# Patient Record
Sex: Male | Born: 1955
Health system: Southern US, Community
[De-identification: ages and names within clinical notes are randomized; demographics above are authoritative.]

## PROBLEM LIST (undated history)

## (undated) HISTORY — PX: OTHER SURGICAL HISTORY: SHX169

## (undated) HISTORY — PX: HOLEP-LASER ENUCLEATION OF THE PROSTATE WITH MORCELLATION: SHX6641

## (undated) HISTORY — PX: LEG AMPUTATION BELOW KNEE: SHX694

---

## 2013-05-14 DIAGNOSIS — T8730 Neuroma of amputation stump, unspecified extremity: Secondary | ICD-10-CM | POA: Insufficient documentation

## 2013-05-14 DIAGNOSIS — Z89519 Acquired absence of unspecified leg below knee: Secondary | ICD-10-CM | POA: Insufficient documentation

## 2014-08-04 DIAGNOSIS — R7989 Other specified abnormal findings of blood chemistry: Secondary | ICD-10-CM | POA: Insufficient documentation

## 2019-02-14 ENCOUNTER — Emergency Department
Admission: EM | Admit: 2019-02-14 | Discharge: 2019-02-14 | Disposition: A | Discharge status: Home / Self Care | Payer: Medicare PPO | Attending: Student in an Organized Health Care Education/Training Program | Admitting: Student in an Organized Health Care Education/Training Program

## 2019-02-14 ENCOUNTER — Emergency Department: Payer: Medicare PPO

## 2019-02-14 ENCOUNTER — Other Ambulatory Visit: Payer: Self-pay

## 2019-02-14 ENCOUNTER — Encounter: Payer: Self-pay | Admitting: Emergency Medicine

## 2019-02-14 DIAGNOSIS — J939 Pneumothorax, unspecified: Secondary | ICD-10-CM | POA: Diagnosis not present

## 2019-02-14 DIAGNOSIS — R0789 Other chest pain: Secondary | ICD-10-CM | POA: Diagnosis present

## 2019-02-14 DIAGNOSIS — R51 Headache: Secondary | ICD-10-CM | POA: Diagnosis not present

## 2019-02-14 MED ORDER — HYDROCODONE-ACETAMINOPHEN 5-325 MG PO TABS: 1.0000 | ORAL_TABLET | ORAL | 0 refills | Status: DC | PRN

## 2019-02-14 MED ORDER — HYDROCODONE-ACETAMINOPHEN 5-325 MG PO TABS
1.0000 | ORAL_TABLET | Freq: Once | ORAL | Status: AC
  Administered 2019-02-14: 15:00:00 1 via ORAL
  Filled 2019-02-14: qty 1

## 2019-02-14 MED ORDER — FENTANYL CITRATE (PF) 100 MCG/2ML IJ SOLN: 50.0000 ug | INTRAMUSCULAR | Status: DC | PRN

## 2019-02-14 MED ORDER — HYDROCODONE-ACETAMINOPHEN 5-325 MG PO TABS
2.0000 | ORAL_TABLET | Freq: Once | ORAL | Status: AC
  Administered 2019-02-14: 2 via ORAL
  Filled 2019-02-14: qty 2
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
## 2019-02-14 NOTE — ED Provider Notes (Signed)
Chest x-ray shows unchanged pneumothorax.  We will discharge the patient.   Arnaldo Natal, MD 02/14/19 [phone]

## 2019-02-14 NOTE — Discharge Instructions (Addendum)
Please return for any increasing shortness of breath chest pain or any other new problems or complaints.  Please follow-up with your regular doctor later on this week.  You can use 2 Advil 3 times a day for 3 days for pain.  You can also use Tylenol.

## 2019-02-14 NOTE — Progress Notes (Signed)
IS education completed, pt tol well. Pt inspire 2546ml+

## 2019-02-14 NOTE — ED Notes (Signed)
Portable imaging staff to bedside.

## 2019-02-14 NOTE — ED Notes (Signed)
AAOx3.  Skin warm and dry.  NAD 

## 2019-02-14 NOTE — ED Notes (Signed)
Report to [employer] to major room 25 via w/c

## 2019-02-17 ENCOUNTER — Inpatient Hospital Stay
Admission: EM | Admit: 2019-02-17 | Discharge: 2019-02-21 | DRG: 603 | Disposition: A | Discharge status: Home / Self Care | Payer: Medicare PPO | Attending: Internal Medicine | Admitting: Internal Medicine

## 2019-02-17 ENCOUNTER — Encounter: Payer: Self-pay | Admitting: Emergency Medicine

## 2019-02-17 ENCOUNTER — Other Ambulatory Visit: Payer: Self-pay

## 2019-02-17 DIAGNOSIS — B9562 Methicillin resistant Staphylococcus aureus infection as the cause of diseases classified elsewhere: Secondary | ICD-10-CM | POA: Diagnosis present

## 2019-02-17 DIAGNOSIS — Z6826 Body mass index (BMI) 26.0-26.9, adult: Secondary | ICD-10-CM

## 2019-02-17 DIAGNOSIS — L0390 Cellulitis, unspecified: Secondary | ICD-10-CM | POA: Diagnosis present

## 2019-02-17 DIAGNOSIS — L538 Other specified erythematous conditions: Secondary | ICD-10-CM | POA: Diagnosis not present

## 2019-02-17 DIAGNOSIS — E663 Overweight: Secondary | ICD-10-CM | POA: Diagnosis present

## 2019-02-17 DIAGNOSIS — Z89519 Acquired absence of unspecified leg below knee: Secondary | ICD-10-CM

## 2019-02-17 DIAGNOSIS — L03211 Cellulitis of face: Secondary | ICD-10-CM | POA: Diagnosis not present

## 2019-02-17 DIAGNOSIS — T368X5A Adverse effect of other systemic antibiotics, initial encounter: Secondary | ICD-10-CM | POA: Diagnosis not present

## 2019-02-17 DIAGNOSIS — F411 Generalized anxiety disorder: Secondary | ICD-10-CM | POA: Diagnosis present

## 2019-02-17 MED ORDER — SODIUM CHLORIDE 0.9 % IV SOLN
1.0000 g | Freq: Once | INTRAVENOUS | Status: AC
  Administered 2019-02-17: 1 g via INTRAVENOUS
  Filled 2019-02-17: qty 10

## 2019-02-17 MED ORDER — CLINDAMYCIN PHOSPHATE 600 MG/50ML IV SOLN
600.0000 mg | Freq: Once | INTRAVENOUS | Status: AC
  Administered 2019-02-18: 600 mg via INTRAVENOUS
  Filled 2019-02-17: qty 50

## 2019-02-17 MED ORDER — CEPHALEXIN 500 MG PO CAPS: 500.0000 mg | ORAL_CAPSULE | Freq: Three times a day (TID) | ORAL | 0 refills | Status: DC

## 2019-02-17 MED ORDER — CLINDAMYCIN HCL 300 MG PO CAPS: 300.0000 mg | ORAL_CAPSULE | Freq: Three times a day (TID) | ORAL | 0 refills | Status: DC
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
## 2019-02-17 NOTE — Discharge Instructions (Signed)
1.  Change your antibiotics to the following: Keflex 500 mg 3 times daily x7 days Clindamycin 300 mg 3 times daily x10 days 2.  Apply warm compress to affected area several times daily. 3.  Return to the ER in 1 day for recheck. 4.  Return to the ER sooner for worsening symptoms, increased redness/swelling, fever, persistent vomiting, difficulty breathing or other concerns.

## 2019-02-17 NOTE — ED Triage Notes (Signed)
Pt reports raised area with redness to the chin x1 day. Pt denies drainage. Pt to ED for concern that area of swelling is going to "cause closure to throat." Pt speaking in complete sentences, airway clear.

## 2019-02-18 ENCOUNTER — Encounter: Payer: Self-pay | Admitting: *Deleted

## 2019-02-18 DIAGNOSIS — L0390 Cellulitis, unspecified: Secondary | ICD-10-CM | POA: Diagnosis present

## 2019-02-18 LAB — CBC WITH DIFFERENTIAL/PLATELET
Abs Immature Granulocytes: 0.03 10*3/uL (ref 0.00–0.07)
Basophils Absolute: 0 10*3/uL (ref 0.0–0.1)
Basophils Relative: 0 %
Eosinophils Absolute: 0.1 10*3/uL (ref 0.0–0.5)
Eosinophils Relative: 1 %
HCT: 42.5 % (ref 39.0–52.0)
Hemoglobin: 14.7 g/dL (ref 13.0–17.0)
Immature Granulocytes: 0 %
Lymphocytes Relative: 11 %
Lymphs Abs: 1.1 10*3/uL (ref 0.7–4.0)
MCH: 31.3 pg (ref 26.0–34.0)
MCHC: 34.6 g/dL (ref 30.0–36.0)
MCV: 90.4 fL (ref 80.0–100.0)
Monocytes Absolute: 0.7 10*3/uL (ref 0.1–1.0)
Monocytes Relative: 7 %
Neutro Abs: 8.4 10*3/uL — ABNORMAL HIGH (ref 1.7–7.7)
Neutrophils Relative %: 81 %
Platelets: 219 10*3/uL (ref 150–400)
RBC: 4.7 MIL/uL (ref 4.22–5.81)
RDW: 12.3 % (ref 11.5–15.5)
WBC: 10.4 10*3/uL (ref 4.0–10.5)
nRBC: 0 % (ref 0.0–0.2)

## 2019-02-18 LAB — BASIC METABOLIC PANEL
Anion gap: 7 (ref 5–15)
BUN: 14 mg/dL (ref 6–20)
CO2: 27 mmol/L (ref 22–32)
Calcium: 8.9 mg/dL (ref 8.9–10.3)
Chloride: 104 mmol/L (ref 98–111)
Creatinine, Ser: 1.08 mg/dL (ref 0.61–1.24)
GFR calc Af Amer: >60 mL/min (ref >60)
GFR calc non Af Amer: >60 mL/min (ref >60)
Glucose, Bld: 94 mg/dL (ref 70–99)
Potassium: 4 mmol/L (ref 3.5–5.1)
Sodium: 138 mmol/L (ref 135–145)

## 2019-02-18 LAB — TSH: TSH: 5.828 u[IU]/mL — ABNORMAL HIGH (ref 0.350–4.500)

## 2019-02-18 MED ORDER — ACETAMINOPHEN 650 MG RE SUPP: 650.0000 mg | Freq: Four times a day (QID) | RECTAL | Status: DC | PRN

## 2019-02-18 MED ORDER — SODIUM CHLORIDE 0.9 % IV SOLN
3.0000 g | Freq: Four times a day (QID) | INTRAVENOUS | Status: DC
  Administered 2019-02-18: 3 g via INTRAVENOUS
  Filled 2019-02-18 (×5): qty 3

## 2019-02-18 MED ORDER — MORPHINE SULFATE (PF) 2 MG/ML IV SOLN
2.0000 mg | Freq: Once | INTRAVENOUS | Status: AC
  Administered 2019-02-18: 2 mg via INTRAVENOUS
  Filled 2019-02-18: qty 1

## 2019-02-18 MED ORDER — VANCOMYCIN HCL 10 G IV SOLR
2000.0000 mg | INTRAVENOUS | Status: DC
  Administered 2019-02-18: 15:00:00 2000 mg via INTRAVENOUS
  Filled 2019-02-18 (×2): qty 2000

## 2019-02-18 MED ORDER — CLINDAMYCIN PHOSPHATE 600 MG/50ML IV SOLN
600.0000 mg | Freq: Three times a day (TID) | INTRAVENOUS | Status: DC
  Administered 2019-02-18 – 2019-02-21 (×9): 600 mg via INTRAVENOUS
  Filled 2019-02-18 (×12): qty 50

## 2019-02-18 MED ORDER — CEFAZOLIN SODIUM-DEXTROSE 1-4 GM/50ML-% IV SOLN
1.0000 g | Freq: Three times a day (TID) | INTRAVENOUS | Status: DC
  Administered 2019-02-18: 05:00:00 1 g via INTRAVENOUS
  Filled 2019-02-18 (×3): qty 50

## 2019-02-18 MED ORDER — HYDROCODONE-ACETAMINOPHEN 5-325 MG PO TABS
1.0000 | ORAL_TABLET | ORAL | Status: DC | PRN
  Administered 2019-02-18 – 2019-02-19 (×7): 1 via ORAL
  Filled 2019-02-18 (×8): qty 1

## 2019-02-18 MED ORDER — SODIUM CHLORIDE 0.9 % IV SOLN
INTRAVENOUS | Status: DC | PRN
  Administered 2019-02-18: 05:00:00 250 mL via INTRAVENOUS
  Administered 2019-02-18 – 2019-02-19 (×3): 500 mL via INTRAVENOUS
  Administered 2019-02-20 – 2019-02-21 (×3): 250 mL via INTRAVENOUS

## 2019-02-18 MED ORDER — ONDANSETRON HCL 4 MG PO TABS: 4.0000 mg | ORAL_TABLET | Freq: Four times a day (QID) | ORAL | Status: DC | PRN

## 2019-02-18 MED ORDER — ONDANSETRON HCL 4 MG/2ML IJ SOLN: 4.0000 mg | Freq: Four times a day (QID) | INTRAMUSCULAR | Status: DC | PRN

## 2019-02-18 MED ORDER — DOCUSATE SODIUM 100 MG PO CAPS
100.0000 mg | ORAL_CAPSULE | Freq: Two times a day (BID) | ORAL | Status: DC
  Administered 2019-02-18 – 2019-02-21 (×7): 100 mg via ORAL
  Filled 2019-02-18 (×7): qty 1

## 2019-02-18 MED ORDER — ENOXAPARIN SODIUM 40 MG/0.4ML ~~LOC~~ SOLN
40.0000 mg | SUBCUTANEOUS | Status: DC
  Filled 2019-02-18 (×3): qty 0.4

## 2019-02-18 MED ORDER — OXYCODONE HCL 5 MG PO TABS
5.0000 mg | ORAL_TABLET | ORAL | Status: DC | PRN
  Administered 2019-02-18 – 2019-02-21 (×17): 5 mg via ORAL
  Filled 2019-02-18 (×17): qty 1

## 2019-02-18 MED ORDER — ACETAMINOPHEN 325 MG PO TABS: 650.0000 mg | ORAL_TABLET | Freq: Four times a day (QID) | ORAL | Status: DC | PRN

## 2019-02-18 NOTE — Progress Notes (Signed)
Patient ID: Peter Sutton, male   DOB: [birth year], 63 y.o.   MRN: [id number]  Nurse concerned about reaction to IV vancomycin with redness up the arm.  She stopped the drip and discontinued.  Switch antibiotics over to clindamycin.  Dr Alford Highland

## 2019-02-18 NOTE — Progress Notes (Signed)
Patient ID: Peter Sutton, male   DOB: Jun 03, 1956, 63 y.o.   MRN: [id number]  Patient seen and examined.  Follow-up Dr. Elias Else history and physical.  Patient with painful swelling on his chin with some fluctuance.  Warm compresses for 20 minutes every few hours.  Change antibiotics to IV vancomycin and Unasyn.  Continue to monitor.  Dr Alford Highland

## 2019-02-18 NOTE — Consult Note (Signed)
Pharmacy Antibiotic Note  Peter Sutton is a 63 y.o. male admitted on 02/17/2019 with cellulitis/wound infection.  Pharmacy has been consulted for Vancomycin/Unasyn dosing.  Plan: Unasyn 3g IV q6h  Vancomycin 2000 mg IV Q 24 hrs. Goal AUC 400-550. Expected AUC: 504 SCr used: 1.08  Height: 5\' 10"  (177.8 cm) Weight: 181 lb 14.4 oz (82.5 kg) IBW/kg (Calculated) : 73  Temp (24hrs), Avg:98.8 F (37.1 C), Min:98.7 F (37.1 C), Max:98.9 F (37.2 C)  Recent Labs  Lab 02/17/19 2349  WBC 10.4  CREATININE 1.08    Estimated Creatinine Clearance: 75.1 mL/min (by C-G formula based on SCr of 1.08 mg/dL).    No Known Allergies  Antimicrobials this admission: 4/30 CTX x 1 5/01 Clindamycin x 1 5/1 Vancomycin >> 5/1 Unasyn >>  Dose adjustments this admission: None  Microbiology results: 5/1 BCx: pending  Thank you for allowing pharmacy to be a part of this patient's care.  Albina Billet, PharmD, BCPS Clinical Pharmacist 02/18/2019 12:02 PM

## 2019-02-18 NOTE — Progress Notes (Signed)
After startving IV vanc, patient's left arm broke out into hives. Medication stopped and IV flushed. MD notified  Roxy Cedar, RN
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
## 2019-02-18 NOTE — ED Notes (Signed)
ED TO INPATIENT HANDOFF REPORT  ED Nurse Name and Phone #: Raphael Gibney Name/Age/Gender Peter Sutton 63 y.o. male Room/Bed: ED19A/ED19A  Code Status   Code Status: Not on file  Home/SNF/Other Home Patient oriented to: self, place, time and situation Is this baseline? Yes   Triage Complete: Triage complete  Chief Complaint abscess  Triage Note Pt reports raised area with redness to the chin x1 day. Pt denies drainage. Pt to ED for concern that area of swelling is going to "cause closure to throat." Pt speaking in complete sentences, airway clear.    Allergies No Known Allergies  Level of Care/Admitting Diagnosis ED Disposition    ED Disposition Condition Comment   Admit  Hospital Area: [facility] [[id number]]  Level of Care: Med-Surg [16]  Covid Evaluation: N/A  Diagnosis: Cellulitis [502774]  Admitting Physician: JERMIE, SCHREIB [[id number]]  Attending Physician: Arnaldo Natal [phone]  PT Class (Do Not Modify): Observation [104]  PT Acc Code (Do Not Modify): Observation [10022]       B Medical/Surgery History History reviewed. No pertinent past medical history. Past Surgical History:  Procedure Laterality Date  . LEG AMPUTATION BELOW KNEE       A IV Location/Drains/Wounds Patient Lines/Drains/Airways Status   Active Line/Drains/Airways    Name:   Placement date:   Placement time:   Site:   Days:   Peripheral IV 02/17/19 Left Antecubital   02/17/19    2347    Antecubital   1          Intake/Output Last 24 hours No intake or output data in the 24 hours ending 02/18/19 0237  Labs/Imaging Results for orders placed or performed during the hospital encounter of 02/17/19 (from the past 48 hour(s))  CBC with Differential     Status: Abnormal   Collection Time: 02/17/19 11:49 PM  Result Value Ref Range   WBC 10.4 4.0 - 10.5 K/uL   RBC 4.70 4.22 - 5.81 MIL/uL   Hemoglobin 14.7 13.0 - 17.0 g/dL   HCT 09.6 28.3 - 66.2 %   MCV 90.4  80.0 - 100.0 fL   MCH 31.3 26.0 - 34.0 pg   MCHC 34.6 30.0 - 36.0 g/dL   RDW 94.7 65.4 - 65.0 %   Platelets 219 150 - 400 K/uL   nRBC 0.0 0.0 - 0.2 %   Neutrophils Relative % 81 %   Neutro Abs 8.4 (H) 1.7 - 7.7 K/uL   Lymphocytes Relative 11 %   Lymphs Abs 1.1 0.7 - 4.0 K/uL   Monocytes Relative 7 %   Monocytes Absolute 0.7 0.1 - 1.0 K/uL   Eosinophils Relative 1 %   Eosinophils Absolute 0.1 0.0 - 0.5 K/uL   Basophils Relative 0 %   Basophils Absolute 0.0 0.0 - 0.1 K/uL   Immature Granulocytes 0 %   Abs Immature Granulocytes 0.03 0.00 - 0.07 K/uL    Comment: Performed at [facility], [address] [place], [place] [address]  Basic metabolic panel     Status: None   Collection Time: 02/17/19 11:49 PM  Result Value Ref Range   Sodium 138 135 - 145 mmol/L   Potassium 4.0 3.5 - 5.1 mmol/L   Chloride 104 98 - 111 mmol/L   CO2 27 22 - 32 mmol/L   Glucose, Bld 94 70 - 99 mg/dL   BUN 14 6 - 20 mg/dL   Creatinine, Ser 6.81 0.61 - 1.24 mg/dL   Calcium 8.9 8.9 -  10.3 mg/dL   GFR calc non Af Amer >60 >60 mL/min   GFR calc Af Amer >60 >60 mL/min   Anion gap 7 5 - 15    Comment: Performed at [facility], [address] [place], [place] [address]   No results found.  Pending Labs Unresulted Labs (From admission, onward)    Start     Ordered   02/17/19 2320  Culture, blood (routine x 2)  BLOOD CULTURE X 2,   STAT     02/17/19 2319   Signed and Held  Creatinine, serum  (enoxaparin (LOVENOX)    CrCl >/= 30 ml/min)  Weekly,   R    Comments:  while on enoxaparin therapy    Signed and Held   Signed and Held  TSH  Add-on,   R     Signed and Held          Vitals/Pain Today's Vitals   02/17/19 2239  BP: (!) 164/76  Pulse: 80  Resp: 20  Temp: 98.9 F (37.2 C)  TempSrc: Oral  SpO2: 98%    Isolation Precautions No active isolations  Medications Medications  morphine 2 MG/ML injection 2 mg (has no administration in time range)  cefTRIAXone  (ROCEPHIN) 1 g in sodium chloride 0.9 % 100 mL IVPB (0 g Intravenous Stopped 02/18/19 0015)  clindamycin (CLEOCIN) IVPB 600 mg (0 mg Intravenous Stopped 02/18/19 0139)    Mobility walks Low fall risk   Focused Assessments    R Recommendations: See Admitting Provider Note  Report given to:   Additional Notes: none

## 2019-02-18 NOTE — Progress Notes (Signed)
Pastoral Care Visit   02/18/19 1600  Clinical Encounter Type  Visited With Patient  Visit Type Initial;Psychological support;Social support  Referral From Nurse  Consult/Referral To Chaplain  Spiritual Encounters  Spiritual Needs Emotional  Stress Factors  Patient Stress Factors Health changes   Pt was sitting up in bed and though had requested info regarding POA, was lonely and wanted someone to talk with. Pt shared about his past marriages, two children, reactions to medicine, travels, finance, jobs, leg amputation, etc.  Pt was cordial and easy to talk with.  Pt invited chap to come back and visit more in the night if bored.     Milinda Antis, [address]

## 2019-02-18 NOTE — Care Management Obs Status (Signed)
MEDICARE OBSERVATION STATUS NOTIFICATION   Patient Details  Name: Peter Sutton MRN: [id number] Date of Birth: 06-09-1956   Medicare Observation Status Notification Given:  Yes    Klaryssa Fauth A Mats Jeanlouis, RN 02/18/2019, 3:42 PM

## 2019-02-19 ENCOUNTER — Observation Stay: Payer: Medicare PPO

## 2019-02-19 ENCOUNTER — Encounter: Payer: Self-pay | Admitting: Radiology

## 2019-02-19 DIAGNOSIS — F411 Generalized anxiety disorder: Secondary | ICD-10-CM | POA: Diagnosis present

## 2019-02-19 DIAGNOSIS — L538 Other specified erythematous conditions: Secondary | ICD-10-CM | POA: Diagnosis not present

## 2019-02-19 DIAGNOSIS — T368X5A Adverse effect of other systemic antibiotics, initial encounter: Secondary | ICD-10-CM | POA: Diagnosis not present

## 2019-02-19 DIAGNOSIS — B9562 Methicillin resistant Staphylococcus aureus infection as the cause of diseases classified elsewhere: Secondary | ICD-10-CM | POA: Diagnosis present

## 2019-02-19 DIAGNOSIS — L03211 Cellulitis of face: Secondary | ICD-10-CM | POA: Diagnosis present

## 2019-02-19 DIAGNOSIS — Z89519 Acquired absence of unspecified leg below knee: Secondary | ICD-10-CM | POA: Diagnosis not present

## 2019-02-19 DIAGNOSIS — E663 Overweight: Secondary | ICD-10-CM | POA: Diagnosis present

## 2019-02-19 DIAGNOSIS — Z6826 Body mass index (BMI) 26.0-26.9, adult: Secondary | ICD-10-CM | POA: Diagnosis not present

## 2019-02-19 LAB — BASIC METABOLIC PANEL
Anion gap: 7 (ref 5–15)
BUN: 15 mg/dL (ref 6–20)
CO2: 27 mmol/L (ref 22–32)
Calcium: 8.8 mg/dL — ABNORMAL LOW (ref 8.9–10.3)
Chloride: 101 mmol/L (ref 98–111)
Creatinine, Ser: 1.12 mg/dL (ref 0.61–1.24)
GFR calc Af Amer: >60 mL/min (ref >60)
GFR calc non Af Amer: >60 mL/min (ref >60)
Glucose, Bld: 110 mg/dL — ABNORMAL HIGH (ref 70–99)
Potassium: 4.1 mmol/L (ref 3.5–5.1)
Sodium: 135 mmol/L (ref 135–145)

## 2019-02-19 LAB — CBC WITH DIFFERENTIAL/PLATELET
Abs Immature Granulocytes: 0.06 10*3/uL (ref 0.00–0.07)
Basophils Absolute: 0 10*3/uL (ref 0.0–0.1)
Basophils Relative: 0 %
Eosinophils Absolute: 0.2 10*3/uL (ref 0.0–0.5)
Eosinophils Relative: 1 %
HCT: 43.2 % (ref 39.0–52.0)
Hemoglobin: 14.9 g/dL (ref 13.0–17.0)
Immature Granulocytes: 1 %
Lymphocytes Relative: 11 %
Lymphs Abs: 1.4 10*3/uL (ref 0.7–4.0)
MCH: 30.8 pg (ref 26.0–34.0)
MCHC: 34.5 g/dL (ref 30.0–36.0)
MCV: 89.4 fL (ref 80.0–100.0)
Monocytes Absolute: 1.1 10*3/uL — ABNORMAL HIGH (ref 0.1–1.0)
Monocytes Relative: 8 %
Neutro Abs: 10.4 10*3/uL — ABNORMAL HIGH (ref 1.7–7.7)
Neutrophils Relative %: 79 %
Platelets: 206 10*3/uL (ref 150–400)
RBC: 4.83 MIL/uL (ref 4.22–5.81)
RDW: 12.6 % (ref 11.5–15.5)
WBC: 13.2 10*3/uL — ABNORMAL HIGH (ref 4.0–10.5)
nRBC: 0 % (ref 0.0–0.2)

## 2019-02-19 MED ORDER — IOHEXOL 300 MG/ML  SOLN
75.0000 mL | Freq: Once | INTRAMUSCULAR | Status: AC | PRN
  Administered 2019-02-19: 75 mL via INTRAVENOUS

## 2019-02-19 MED ORDER — HYDROCODONE-ACETAMINOPHEN 5-325 MG PO TABS
2.0000 | ORAL_TABLET | ORAL | Status: DC | PRN
  Administered 2019-02-19 – 2019-02-21 (×9): 2 via ORAL
  Filled 2019-02-19 (×9): qty 2

## 2019-02-19 MED ORDER — ALPRAZOLAM 0.5 MG PO TABS
0.5000 mg | ORAL_TABLET | Freq: Three times a day (TID) | ORAL | Status: DC | PRN
  Administered 2019-02-19 – 2019-02-21 (×4): 0.5 mg via ORAL
  Filled 2019-02-19 (×4): qty 1

## 2019-02-19 MED ORDER — VANCOMYCIN HCL IN DEXTROSE 1-5 GM/200ML-% IV SOLN
1000.0000 mg | Freq: Two times a day (BID) | INTRAVENOUS | Status: DC
  Administered 2019-02-19: 1000 mg via INTRAVENOUS
  Filled 2019-02-19 (×2): qty 200

## 2019-02-19 MED ORDER — SODIUM CHLORIDE 0.9 % IV SOLN
3.0000 g | Freq: Four times a day (QID) | INTRAVENOUS | Status: DC
  Administered 2019-02-19 – 2019-02-21 (×9): 3 g via INTRAVENOUS
  Filled 2019-02-19 (×12): qty 3

## 2019-02-19 MED ORDER — KETOROLAC TROMETHAMINE 30 MG/ML IJ SOLN
30.0000 mg | Freq: Four times a day (QID) | INTRAMUSCULAR | Status: DC | PRN
  Administered 2019-02-19: 30 mg via INTRAVENOUS
  Filled 2019-02-19: qty 1

## 2019-02-19 MED ORDER — DIPHENHYDRAMINE HCL 25 MG PO CAPS
25.0000 mg | ORAL_CAPSULE | Freq: Four times a day (QID) | ORAL | Status: DC | PRN
  Administered 2019-02-19: 25 mg via ORAL
  Filled 2019-02-19: qty 1
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
## 2019-02-19 NOTE — Progress Notes (Signed)
Pharmacy Antibiotic Note  Ruston Rapozo is a 63 y.o. male admitted on 02/17/2019 with cellulitis.  Pharmacy has been consulted for Vancomycin and Unasyn dosing.  Plan: Unasyn 3g IV every 6 hours.  Vancomycin 1 g IV Q 12 hrs. Goal AUC 400-550. Expected AUC: 504.5 SCr used: 1.12   Height: 5\' 10"  (177.8 cm) Weight: 181 lb 14.1 oz (82.5 kg) IBW/kg (Calculated) : 73  Temp (24hrs), Avg:99 F (37.2 C), Min:98.8 F (37.1 C), Max:99.2 F (37.3 C)  Recent Labs  Lab 02/17/19 2349 02/19/19 0922  WBC 10.4 13.2*  CREATININE 1.08 1.12    Estimated Creatinine Clearance: 72.4 mL/min (by C-G formula based on SCr of 1.12 mg/dL).    No Known Allergies  Antimicrobials this admission: Clindamycin 5/1 >>  Unasyn 5/2 >>  Vancomycin 5/2 >>  Dose adjustments this admission: N/A  Microbiology results: 4/30 BCx: NG x 2  Thank you for allowing pharmacy to be a part of this patient's care.  Stormy Card, [facility] 02/19/2019 10:11 AM

## 2019-02-19 NOTE — Progress Notes (Signed)
Pt unable to eat due to pain and requested stronger pain med or to increase dose of pain med. Dr patel notified of above and will place orders.

## 2019-02-19 NOTE — Progress Notes (Signed)
Vancomycin initiated, but had to stop it due to pt c/o sob.  Pt also said that he can taste the medicine. Pt did not seem sob. VSS all stable. Benadryl given prior to vanc administration. Pt has been anxious all day. Patel notified of the above. Orders received.

## 2019-02-19 NOTE — Progress Notes (Signed)
Notified Dr Allena Katz that pt wants to know if he can get benadryl prior to vancomycin. Order received.

## 2019-02-19 NOTE — Plan of Care (Signed)
Pt very anxious today. Frequent assistance requests/questions.  Pt also had some involuntary LE movement that happen when pt anxious/stressed per pt.  Xanax initiated with much improvement. Pain meds given throughout the shift. Pain alleviated with toradol, pt was able to eat. Seems that pain better managed since pt had xanax.

## 2019-02-20 LAB — CBC
HCT: 42.7 % (ref 39.0–52.0)
Hemoglobin: 14.5 g/dL (ref 13.0–17.0)
MCH: 31.2 pg (ref 26.0–34.0)
MCHC: 34 g/dL (ref 30.0–36.0)
MCV: 91.8 fL (ref 80.0–100.0)
Platelets: 205 10*3/uL (ref 150–400)
RBC: 4.65 MIL/uL (ref 4.22–5.81)
RDW: 12.5 % (ref 11.5–15.5)
WBC: 8.5 10*3/uL (ref 4.0–10.5)
nRBC: 0 % (ref 0.0–0.2)

## 2019-02-20 NOTE — Progress Notes (Signed)
[facility] sent lab report of facial skin/wound culture positive for MRSA.  Dr Allena Katz notified of the above. Continue Clindamyin and to initiate contact precautions.
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
## 2019-02-21 MED ORDER — ALPRAZOLAM 0.5 MG PO TABS: 0.5000 mg | ORAL_TABLET | Freq: Three times a day (TID) | ORAL | 0 refills | Status: AC | PRN

## 2019-02-21 MED ORDER — HYDROCODONE-ACETAMINOPHEN 5-325 MG PO TABS: 1.0000 | ORAL_TABLET | Freq: Four times a day (QID) | ORAL | 0 refills | Status: DC | PRN

## 2019-02-21 MED ORDER — CLINDAMYCIN HCL 300 MG PO CAPS: 300.0000 mg | ORAL_CAPSULE | Freq: Three times a day (TID) | ORAL | 0 refills | Status: AC

## 2019-02-21 MED ORDER — AMOXICILLIN-POT CLAVULANATE 875-125 MG PO TABS: 1.0000 | ORAL_TABLET | Freq: Two times a day (BID) | ORAL | 0 refills | Status: AC
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
## 2019-02-21 NOTE — Progress Notes (Signed)
Discharge instructions reviewed with patient. Verbalized understanding. IV removed. Patient stable. Patient now waiting on ride to get here to pick him up.

## 2019-02-22 LAB — HEPATITIS B SURFACE ANTIBODY, QUANTITATIVE: Hep B S AB Quant (Post): <3.1 m[IU]/mL — ABNORMAL LOW (ref >9.9)

## 2019-02-22 LAB — HEPATITIS PANEL, ACUTE
HCV Ab: >11 s/co ratio — ABNORMAL HIGH (ref 0.0–0.9)
HCV Ab: >11 s/co ratio — ABNORMAL HIGH (ref 0.0–0.9)
Hep A IgM: NEGATIVE
Hep A IgM: NEGATIVE
Hep B C IgM: NEGATIVE
Hep B C IgM: NEGATIVE
Hepatitis B Surface Ag: NEGATIVE
Hepatitis B Surface Ag: NEGATIVE

## 2019-02-22 LAB — CULTURE, BLOOD (ROUTINE X 2)
Culture: NO GROWTH
Culture: NO GROWTH
Special Requests: ADEQUATE
Special Requests: ADEQUATE

## 2019-02-22 LAB — HEPATITIS B SURFACE ANTIGEN: Hepatitis B Surface Ag: NEGATIVE

## 2019-02-22 LAB — HIV ANTIBODY (ROUTINE TESTING W REFLEX): HIV Screen 4th Generation wRfx: NONREACTIVE

## 2019-02-22 LAB — HEPATITIS B CORE ANTIBODY, TOTAL: Hep B Core Total Ab: NEGATIVE

## 2019-05-31 ENCOUNTER — Ambulatory Visit: Payer: Medicare PPO | Admitting: Infectious Diseases

## 2019-06-03 ENCOUNTER — Encounter: Payer: Self-pay | Admitting: Licensed Clinical Social Worker

## 2019-06-03 DIAGNOSIS — F909 Attention-deficit hyperactivity disorder, unspecified type: Secondary | ICD-10-CM | POA: Insufficient documentation

## 2019-06-03 DIAGNOSIS — F419 Anxiety disorder, unspecified: Secondary | ICD-10-CM | POA: Insufficient documentation

## 2019-06-09 ENCOUNTER — Other Ambulatory Visit
Admission: RE | Admit: 2019-06-09 | Discharge: 2019-06-09 | Disposition: A | Discharge status: Home / Self Care | Payer: Medicare PPO | Attending: Infectious Diseases | Admitting: Infectious Diseases

## 2019-06-09 ENCOUNTER — Telehealth: Payer: Self-pay | Admitting: Licensed Clinical Social Worker

## 2019-06-09 ENCOUNTER — Encounter: Payer: Self-pay | Admitting: Infectious Diseases

## 2019-06-09 ENCOUNTER — Other Ambulatory Visit: Payer: Self-pay

## 2019-06-09 ENCOUNTER — Ambulatory Visit: Payer: No Typology Code available for payment source | Attending: Infectious Diseases | Admitting: Infectious Diseases

## 2019-06-09 VITALS — BP 138/77 | HR 56 | Temp 97.9°F | Ht 70.0 in | Wt 179.2 lb

## 2019-06-09 DIAGNOSIS — Z9889 Other specified postprocedural states: Secondary | ICD-10-CM

## 2019-06-09 DIAGNOSIS — B1920 Unspecified viral hepatitis C without hepatic coma: Secondary | ICD-10-CM | POA: Diagnosis not present

## 2019-06-09 DIAGNOSIS — B182 Chronic viral hepatitis C: Secondary | ICD-10-CM | POA: Diagnosis present

## 2019-06-09 DIAGNOSIS — Z89512 Acquired absence of left leg below knee: Secondary | ICD-10-CM

## 2019-06-09 LAB — BASIC METABOLIC PANEL
Anion gap: 7 (ref 5–15)
BUN: 15 mg/dL (ref 6–20)
CO2: 24 mmol/L (ref 22–32)
Calcium: 9.2 mg/dL (ref 8.9–10.3)
Chloride: 106 mmol/L (ref 98–111)
Creatinine, Ser: 0.97 mg/dL (ref 0.61–1.24)
GFR calc Af Amer: >60 mL/min (ref >60)
GFR calc non Af Amer: >60 mL/min (ref >60)
Glucose, Bld: 91 mg/dL (ref 70–99)
Potassium: 4.1 mmol/L (ref 3.5–5.1)
Sodium: 137 mmol/L (ref 135–145)

## 2019-06-09 LAB — APTT: aPTT: 32 seconds (ref 24–36)

## 2019-06-09 LAB — PROTIME-INR
INR: 1.1 (ref 0.8–1.2)
Prothrombin Time: 13.6 seconds (ref 11.4–15.2)

## 2019-06-09 LAB — HEPATIC FUNCTION PANEL
ALT: 61 U/L — ABNORMAL HIGH (ref 0–44)
AST: 49 U/L — ABNORMAL HIGH (ref 15–41)
Albumin: 4.2 g/dL (ref 3.5–5.0)
Alkaline Phosphatase: 46 U/L (ref 38–126)
Bilirubin, Direct: 0.1 mg/dL (ref 0.0–0.2)
Indirect Bilirubin: 0.9 mg/dL (ref 0.3–0.9)
Total Bilirubin: 1 mg/dL (ref 0.3–1.2)
Total Protein: 7.5 g/dL (ref 6.5–8.1)

## 2019-06-09 NOTE — Telephone Encounter (Signed)
Left message for patient to return my call.  Patient will need ultrasound and is scheduled for 06/16/2019 at 8:30am. Also will need to have CBC drawn that same day at the medical mall after ultrasound is complete.
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
## 2019-06-09 NOTE — Patient Instructions (Signed)
You are here to start treatment for hepc- We will do some labs today and I will discuss with my pharmacist for insurance clearance for the medication.you will be either on Epclusa which is 1 pill a day for 12 weeks or Mavyret . Will contact you once all the tests are available and we have the insurance approval

## 2019-06-10 LAB — HEPATITIS B SURFACE ANTIGEN: Hepatitis B Surface Ag: NEGATIVE

## 2019-06-10 LAB — HEPATITIS A ANTIBODY, TOTAL: hep A Total Ab: NEGATIVE

## 2019-06-10 LAB — HEPATITIS B SURFACE ANTIBODY, QUANTITATIVE: Hepatitis B-Post: <3.1 m[IU]/mL — ABNORMAL LOW (ref >9.9)

## 2019-06-12 LAB — HEPATITIS C GENOTYPE

## 2019-06-13 ENCOUNTER — Other Ambulatory Visit: Payer: Self-pay

## 2019-06-13 ENCOUNTER — Ambulatory Visit
Admission: RE | Admit: 2019-06-13 | Discharge: 2019-06-13 | Disposition: A | Discharge status: Home / Self Care | Payer: Medicare PPO | Source: Ambulatory Visit | Attending: Infectious Diseases | Admitting: Infectious Diseases

## 2019-06-13 ENCOUNTER — Other Ambulatory Visit: Payer: Self-pay | Admitting: Pharmacist

## 2019-06-13 ENCOUNTER — Other Ambulatory Visit: Payer: Self-pay | Admitting: Infectious Diseases

## 2019-06-13 ENCOUNTER — Telehealth: Payer: Self-pay | Admitting: Infectious Diseases

## 2019-06-13 DIAGNOSIS — B182 Chronic viral hepatitis C: Secondary | ICD-10-CM

## 2019-06-13 MED ORDER — SOFOSBUVIR-VELPATASVIR 400-100 MG PO TABS: 1.0000 | ORAL_TABLET | Freq: Every day | ORAL | 2 refills | Status: DC

## 2019-06-13 NOTE — Telephone Encounter (Signed)
Called patient and let him know of the [place] elastography result and Genotype (1 a). Spoke to [employer] at [employer]- She will work on getting PA for [employer] [name]) once a day for 12 weeks

## 2019-06-13 NOTE — Progress Notes (Signed)
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  After discussion with Dr. Delaine Lame, will send in Epclusa x 12 weeks to [facility] and we will begin the prior authorization through his [facility] insurance.  Will alert the patient and provider one he is approved.

## 2019-06-14 ENCOUNTER — Other Ambulatory Visit: Payer: Self-pay

## 2019-06-14 ENCOUNTER — Telehealth: Payer: Self-pay | Admitting: Pharmacy Technician

## 2019-06-14 ENCOUNTER — Telehealth: Payer: Self-pay | Admitting: Pharmacist

## 2019-06-14 ENCOUNTER — Other Ambulatory Visit
Admission: RE | Admit: 2019-06-14 | Discharge: 2019-06-14 | Disposition: A | Discharge status: Home / Self Care | Payer: Medicare PPO | Attending: Infectious Diseases | Admitting: Infectious Diseases

## 2019-06-14 DIAGNOSIS — B182 Chronic viral hepatitis C: Secondary | ICD-10-CM | POA: Insufficient documentation

## 2019-06-14 LAB — CBC WITH DIFFERENTIAL/PLATELET
Abs Immature Granulocytes: 0.01 10*3/uL (ref 0.00–0.07)
Basophils Absolute: 0 10*3/uL (ref 0.0–0.1)
Basophils Relative: 1 %
Eosinophils Absolute: 0.1 10*3/uL (ref 0.0–0.5)
Eosinophils Relative: 2 %
HCT: 45.1 % (ref 39.0–52.0)
Hemoglobin: 15.9 g/dL (ref 13.0–17.0)
Immature Granulocytes: 0 %
Lymphocytes Relative: 29 %
Lymphs Abs: 1.7 10*3/uL (ref 0.7–4.0)
MCH: 30.5 pg (ref 26.0–34.0)
MCHC: 35.3 g/dL (ref 30.0–36.0)
MCV: 86.4 fL (ref 80.0–100.0)
Monocytes Absolute: 0.4 10*3/uL (ref 0.1–1.0)
Monocytes Relative: 7 %
Neutro Abs: 3.7 10*3/uL (ref 1.7–7.7)
Neutrophils Relative %: 61 %
Platelets: 207 10*3/uL (ref 150–400)
RBC: 5.22 MIL/uL (ref 4.22–5.81)
RDW: 12.9 % (ref 11.5–15.5)
WBC: 5.9 10*3/uL (ref 4.0–10.5)
nRBC: 0 % (ref 0.0–0.2)

## 2019-06-14 LAB — BASIC METABOLIC PANEL
Anion gap: 9 (ref 5–15)
BUN: 15 mg/dL (ref 6–20)
CO2: 24 mmol/L (ref 22–32)
Calcium: 9.6 mg/dL (ref 8.9–10.3)
Chloride: 108 mmol/L (ref 98–111)
Creatinine, Ser: 0.87 mg/dL (ref 0.61–1.24)
GFR calc Af Amer: >60 mL/min (ref >60)
GFR calc non Af Amer: >60 mL/min (ref >60)
Glucose, Bld: 96 mg/dL (ref 70–99)
Potassium: 4.1 mmol/L (ref 3.5–5.1)
Sodium: 141 mmol/L (ref 135–145)

## 2019-06-14 NOTE — Telephone Encounter (Signed)
Thanks for your help.

## 2019-06-14 NOTE — Telephone Encounter (Signed)
[facility] Patient Advocate Encounter   Received notification from [facility] that prior authorization for [name] is required.   PA submitted on 06/14/2019 Key [id number] Status is pending    [facility] will continue to follow.  Bartholomew Crews, CPhT Specialty Pharmacy Patient [facility] for Infectious Disease Phone: [phone] Fax: [phone] 06/14/2019 8:06 AM

## 2019-06-14 NOTE — Progress Notes (Signed)
We will mail it to his house per his request!

## 2019-06-14 NOTE — Addendum Note (Signed)
Addended by: Santiago Bur on: 06/14/2019 12:03 PM   Modules accepted: Orders

## 2019-06-14 NOTE — Telephone Encounter (Signed)
[facility] Patient Advocate Encounter  Prior Authorization for Peter Sutton has been approved.    Effective dates: 06/14/2019 through 09/06/2019  Patients co-pay is $2391.85. (patient in coverage gap) Will apply for [employer] once we reach out to the patient  [facility] will continue to follow.  Bartholomew Crews, CPhT Specialty Pharmacy Patient [facility] for Infectious Disease Phone: [phone] Fax: [phone] 06/14/2019 9:50 AM

## 2019-06-14 NOTE — Telephone Encounter (Addendum)
[facility] Patient Advocate Encounter   I was successful in securing patient a $30,000 grant from [employer] to provide copayment coverage for Epclusa. This will make the out of pocket cost $0.     I have spoken with the patient and will mail medication tomorrow to arrive Thursday of this week.    The billing information is as follows and has been shared with [place].   RxBin: [id number] PCN: [id number] Member ID: [id number] Group ID: [id number] Dates of Eligibility: 05/15/2019 through 05/13/2020  Patient knows to call the office with questions or concerns.  Bartholomew Crews, CPhT Specialty Pharmacy Patient [facility] for Infectious Disease Phone: [phone] Fax: [phone] 06/14/2019 2:56 PM

## 2019-06-14 NOTE — Telephone Encounter (Signed)
Patient is approved to receive Epclusa x 12 weeks for chronic Hepatitis C infection. Counseled patient to take Epclusa daily with or without food. Encouraged patient not to miss any doses and explained how their chance of cure could go down with each dose missed. Counseled patient on what to do if dose is missed - if it is closer to the missed dose take immediately; if closer to next dose skip dose and take the next dose at the usual time.   Counseled patient on common side effects such as headache, fatigue, and nausea and that these normally decrease with time. I reviewed patient medications and found no drug interactions. Discussed with patient that there are several drug interactions including acid suppressants. Instructed patient to call clinic if he wishes to start a new medication during course of therapy. Also advised patient to call if he experiences any side effects. Patient will follow-up with Dr. Delaine Lame on 9/22 at 11am.

## 2019-06-14 NOTE — Telephone Encounter (Signed)
FYI - Dr. Delaine Lame, he is good to go and will get the medication Thursday!

## 2019-06-14 NOTE — Telephone Encounter (Signed)
Thank you :)

## 2019-06-15 LAB — AFP TUMOR MARKER: AFP, Serum, Tumor Marker: 3.9 ng/mL (ref 0.0–8.3)

## 2019-06-15 LAB — HIV ANTIBODY (ROUTINE TESTING W REFLEX): HIV Screen 4th Generation wRfx: NONREACTIVE

## 2019-06-15 MED FILL — SOFOSBUVIR-VELPATASVIR 400-: 400-100 | 28 days supply | Qty: 28 | Fill #0

## 2019-06-16 ENCOUNTER — Ambulatory Visit: Payer: No Typology Code available for payment source

## 2019-06-29 ENCOUNTER — Telehealth: Payer: Self-pay | Admitting: Licensed Clinical Social Worker

## 2019-06-29 NOTE — Telephone Encounter (Signed)
I called the patient to see how he was doing on his Hep C medication. I left a message for him to call back. I also reminded him of his follow up visit on 07/12/19

## 2019-07-04 NOTE — Telephone Encounter (Signed)
Patient returned my call today, he states that he is doing great on the medication and will be here for his follow up visit on 07/12/2019

## 2019-07-04 NOTE — Telephone Encounter (Signed)
Thanks

## 2019-07-12 ENCOUNTER — Other Ambulatory Visit: Payer: Self-pay

## 2019-07-12 ENCOUNTER — Ambulatory Visit: Payer: No Typology Code available for payment source | Attending: Infectious Diseases | Admitting: Infectious Diseases

## 2019-07-12 ENCOUNTER — Encounter: Payer: Self-pay | Admitting: Infectious Diseases

## 2019-07-12 ENCOUNTER — Other Ambulatory Visit
Admission: RE | Admit: 2019-07-12 | Discharge: 2019-07-12 | Disposition: A | Discharge status: Home / Self Care | Payer: Medicare PPO | Source: Ambulatory Visit | Attending: Infectious Diseases | Admitting: Infectious Diseases

## 2019-07-12 VITALS — BP 146/85 | HR 59 | Temp 97.8°F | Wt 182.5 lb

## 2019-07-12 DIAGNOSIS — B181 Chronic viral hepatitis B without delta-agent: Secondary | ICD-10-CM | POA: Diagnosis not present

## 2019-07-12 DIAGNOSIS — Z23 Encounter for immunization: Secondary | ICD-10-CM

## 2019-07-12 DIAGNOSIS — B182 Chronic viral hepatitis C: Secondary | ICD-10-CM | POA: Diagnosis present

## 2019-07-12 DIAGNOSIS — Z79899 Other long term (current) drug therapy: Secondary | ICD-10-CM

## 2019-07-12 DIAGNOSIS — Z8614 Personal history of Methicillin resistant Staphylococcus aureus infection: Secondary | ICD-10-CM

## 2019-07-12 DIAGNOSIS — Z89512 Acquired absence of left leg below knee: Secondary | ICD-10-CM

## 2019-07-12 DIAGNOSIS — Z9714 Presence of artificial left leg (complete) (partial): Secondary | ICD-10-CM

## 2019-07-12 DIAGNOSIS — B1920 Unspecified viral hepatitis C without hepatic coma: Secondary | ICD-10-CM | POA: Diagnosis not present

## 2019-07-12 DIAGNOSIS — Z872 Personal history of diseases of the skin and subcutaneous tissue: Secondary | ICD-10-CM

## 2019-07-12 DIAGNOSIS — Z8739 Personal history of other diseases of the musculoskeletal system and connective tissue: Secondary | ICD-10-CM

## 2019-07-12 LAB — CBC WITH DIFFERENTIAL/PLATELET
Abs Immature Granulocytes: 0.01 10*3/uL (ref 0.00–0.07)
Basophils Absolute: 0 10*3/uL (ref 0.0–0.1)
Basophils Relative: 1 %
Eosinophils Absolute: 0.1 10*3/uL (ref 0.0–0.5)
Eosinophils Relative: 2 %
HCT: 44 % (ref 39.0–52.0)
Hemoglobin: 15.6 g/dL (ref 13.0–17.0)
Immature Granulocytes: 0 %
Lymphocytes Relative: 28 %
Lymphs Abs: 1.7 10*3/uL (ref 0.7–4.0)
MCH: 30.6 pg (ref 26.0–34.0)
MCHC: 35.5 g/dL (ref 30.0–36.0)
MCV: 86.3 fL (ref 80.0–100.0)
Monocytes Absolute: 0.4 10*3/uL (ref 0.1–1.0)
Monocytes Relative: 6 %
Neutro Abs: 3.8 10*3/uL (ref 1.7–7.7)
Neutrophils Relative %: 63 %
Platelets: 205 10*3/uL (ref 150–400)
RBC: 5.1 MIL/uL (ref 4.22–5.81)
RDW: 12.7 % (ref 11.5–15.5)
WBC: 6 10*3/uL (ref 4.0–10.5)
nRBC: 0 % (ref 0.0–0.2)

## 2019-07-12 LAB — COMPREHENSIVE METABOLIC PANEL
ALT: 27 U/L (ref 0–44)
AST: 27 U/L (ref 15–41)
Albumin: 4.3 g/dL (ref 3.5–5.0)
Alkaline Phosphatase: 41 U/L (ref 38–126)
Anion gap: 10 (ref 5–15)
BUN: 15 mg/dL (ref 8–23)
CO2: 23 mmol/L (ref 22–32)
Calcium: 9.3 mg/dL (ref 8.9–10.3)
Chloride: 107 mmol/L (ref 98–111)
Creatinine, Ser: 0.96 mg/dL (ref 0.61–1.24)
GFR calc Af Amer: >60 mL/min (ref >60)
GFR calc non Af Amer: >60 mL/min (ref >60)
Glucose, Bld: 114 mg/dL — ABNORMAL HIGH (ref 70–99)
Potassium: 4 mmol/L (ref 3.5–5.1)
Sodium: 140 mmol/L (ref 135–145)
Total Bilirubin: 0.8 mg/dL (ref 0.3–1.2)
Total Protein: 7.3 g/dL (ref 6.5–8.1)

## 2019-07-12 MED FILL — SOFOSBUVIR-VELPATASVIR 400-: 400-100 | 28 days supply | Qty: 28 | Fill #1
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
## 2019-07-12 NOTE — Patient Instructions (Signed)
You are here for HEPC follow up- you are completing one month of Epclusa- you have no side effects and you say you are 100% adherent. Will do labs today will also give flu vaccine, hepA and Hep B vaccine. Your 2nd dose of hepb will be in 1 month ( OCT 22) and you can get pneumonia vaccine then as well- your 3rd dose of HEP B is in 6 months when you will also get your 2nd dose of Hep A ( March 22/2021 Follow up appt with me in 2 months

## 2019-07-13 LAB — HCV RNA QUANT
HCV Quantitative Log: 1.602 log10 IU/mL — ABNORMAL LOW (ref >1.70)
HCV Quantitative: 40 IU/mL — ABNORMAL LOW (ref >50)

## 2019-08-05 MED FILL — SOFOSBUVIR-VELPATASVIR 400-: 400-100 | 28 days supply | Qty: 28 | Fill #2

## 2019-08-11 ENCOUNTER — Ambulatory Visit
Payer: No Typology Code available for payment source | Attending: Infectious Diseases | Admitting: Licensed Clinical Social Worker

## 2019-08-11 ENCOUNTER — Other Ambulatory Visit: Payer: Self-pay

## 2019-08-11 DIAGNOSIS — Z23 Encounter for immunization: Secondary | ICD-10-CM

## 2019-08-11 DIAGNOSIS — B182 Chronic viral hepatitis C: Secondary | ICD-10-CM

## 2019-09-20 ENCOUNTER — Encounter: Payer: Self-pay | Admitting: Infectious Diseases

## 2019-09-20 ENCOUNTER — Other Ambulatory Visit
Admission: RE | Admit: 2019-09-20 | Discharge: 2019-09-20 | Disposition: A | Discharge status: Home / Self Care | Payer: Medicare PPO | Source: Ambulatory Visit | Attending: Infectious Diseases | Admitting: Infectious Diseases

## 2019-09-20 ENCOUNTER — Ambulatory Visit: Payer: Medicare PPO | Attending: Infectious Diseases | Admitting: Infectious Diseases

## 2019-09-20 ENCOUNTER — Other Ambulatory Visit: Payer: Self-pay

## 2019-09-20 VITALS — BP 152/77 | HR 52 | Temp 98.6°F | Resp 16 | Ht 70.0 in | Wt 184.0 lb

## 2019-09-20 DIAGNOSIS — B182 Chronic viral hepatitis C: Secondary | ICD-10-CM | POA: Diagnosis present

## 2019-09-20 DIAGNOSIS — Z8619 Personal history of other infectious and parasitic diseases: Secondary | ICD-10-CM | POA: Diagnosis not present

## 2019-09-20 DIAGNOSIS — Z8739 Personal history of other diseases of the musculoskeletal system and connective tissue: Secondary | ICD-10-CM

## 2019-09-20 DIAGNOSIS — Z8614 Personal history of Methicillin resistant Staphylococcus aureus infection: Secondary | ICD-10-CM

## 2019-09-20 DIAGNOSIS — Z09 Encounter for follow-up examination after completed treatment for conditions other than malignant neoplasm: Secondary | ICD-10-CM

## 2019-09-20 DIAGNOSIS — Z89512 Acquired absence of left leg below knee: Secondary | ICD-10-CM

## 2019-09-20 DIAGNOSIS — Z85820 Personal history of malignant melanoma of skin: Secondary | ICD-10-CM

## 2019-09-20 LAB — COMPREHENSIVE METABOLIC PANEL
ALT: 41 U/L (ref 0–44)
AST: 34 U/L (ref 15–41)
Albumin: 4.6 g/dL (ref 3.5–5.0)
Alkaline Phosphatase: 49 U/L (ref 38–126)
Anion gap: 7 (ref 5–15)
BUN: 17 mg/dL (ref 8–23)
CO2: 24 mmol/L (ref 22–32)
Calcium: 9.5 mg/dL (ref 8.9–10.3)
Chloride: 108 mmol/L (ref 98–111)
Creatinine, Ser: 1 mg/dL (ref 0.61–1.24)
GFR calc Af Amer: >60 mL/min (ref >60)
GFR calc non Af Amer: >60 mL/min (ref >60)
Glucose, Bld: 98 mg/dL (ref 70–99)
Potassium: 4.1 mmol/L (ref 3.5–5.1)
Sodium: 139 mmol/L (ref 135–145)
Total Bilirubin: 0.9 mg/dL (ref 0.3–1.2)
Total Protein: 7.8 g/dL (ref 6.5–8.1)
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
## 2019-09-20 NOTE — Patient Instructions (Signed)
You are here for follow up of Peter Sutton. You have completed 12 weeks of treatment. Today we will do labs and then will repeat 6 months-1 year

## 2019-09-21 LAB — HCV RNA QUANT: HCV Quantitative: NOT DETECTED IU/mL (ref >50)

## 2019-11-02 ENCOUNTER — Encounter: Payer: Self-pay | Admitting: Urology

## 2019-11-02 ENCOUNTER — Ambulatory Visit: Payer: Medicare PPO | Admitting: Urology

## 2019-11-02 ENCOUNTER — Other Ambulatory Visit: Payer: Self-pay

## 2019-11-02 VITALS — BP 165/95 | HR 54 | Ht 70.0 in | Wt 189.0 lb

## 2019-11-02 DIAGNOSIS — N3943 Post-void dribbling: Secondary | ICD-10-CM | POA: Diagnosis not present

## 2019-11-02 DIAGNOSIS — N401 Enlarged prostate with lower urinary tract symptoms: Secondary | ICD-10-CM | POA: Diagnosis not present

## 2019-11-02 DIAGNOSIS — R351 Nocturia: Secondary | ICD-10-CM

## 2019-11-02 DIAGNOSIS — R3912 Poor urinary stream: Secondary | ICD-10-CM | POA: Diagnosis not present

## 2019-11-02 LAB — URINALYSIS, COMPLETE
Bilirubin, UA: NEGATIVE
Glucose, UA: NEGATIVE
Ketones, UA: NEGATIVE
Leukocytes,UA: NEGATIVE
Nitrite, UA: NEGATIVE
Protein,UA: NEGATIVE
RBC, UA: NEGATIVE
Specific Gravity, UA: <1.005 — ABNORMAL LOW (ref 1.005–1.030)
Urobilinogen, Ur: 0.2 mg/dL (ref 0.2–1.0)
pH, UA: 5.5 (ref 5.0–7.5)

## 2019-11-02 LAB — MICROSCOPIC EXAMINATION
Bacteria, UA: NONE SEEN
RBC: NONE SEEN /hpf (ref 0–2)
WBC, UA: NONE SEEN /hpf (ref 0–5)

## 2019-11-02 MED ORDER — TAMSULOSIN HCL 0.4 MG PO CAPS: 0.4000 mg | ORAL_CAPSULE | Freq: Every day | ORAL | 11 refills | Status: AC

## 2019-11-02 NOTE — Progress Notes (Signed)
11/02/2019 9:04 AM   Oneta Rack 1956/06/18 [id number]  Referring provider: Baxter Hire, MD [place],  [place] [address]  Chief Complaint  Patient presents with  . New Patient (Initial Visit)    HPI: 64 year old male who presents today for further evaluation of urinary symptoms.  He reports he has a personal history of enlarged prostate.  He was seen remotely by a urologist in [place] last in 2011 with similar symptoms.  He is status post PVP in 2008.  He reports that over the past 3 or so years, his symptoms have been getting progressively worse again.  He initially did well after surgery.  His symptoms include nocturia x2-3 which makes him fatigued during the day.  He also reports weakening of his urinary stream, hesitancy and postvoid dribbling.  These are all very bothersome to him.    He takes no medications prefers to avoid pills that will fall possible.  No dysuria or gross hematuria.  No history of urinary retention.  No UTIs.  Most recent PSA 0.58 on 05/02/19  IPSS    Row Name 11/02/19 0800         International Prostate Symptom Score   How often have you had the sensation of not emptying your bladder?  More than half the time     How often have you had to urinate less than every two hours?  Almost always     How often have you found you stopped and started again several times when you urinated?  Almost always     How often have you found it difficult to postpone urination?  Less than half the time     How often have you had a weak urinary stream?  More than half the time     How often have you had to strain to start urination?  About half the time     How many times did you typically get up at night to urinate?  3 Times     Total IPSS Score  26       Quality of Life due to urinary symptoms   If you were to spend the rest of your life with your urinary condition just the way it is now how would you feel about that?  Mostly Disatisfied         Score:  1-7 Mild 8-19 Moderate 20-35 Severe    PMH: No past medical history on file.  Surgical History: Past Surgical History:  Procedure Laterality Date  . HOLEP-LASER ENUCLEATION OF THE PROSTATE WITH MORCELLATION     15 years ago  . LEG AMPUTATION BELOW KNEE      Home Medications:  Allergies as of 11/02/2019      Reactions   No Known Allergies       Medication List       Accurate as of November 02, 2019  9:04 AM. If you have any questions, ask your nurse or doctor.        tamsulosin 0.4 MG Caps capsule Commonly known as: Flomax Take 1 capsule (0.4 mg total) by mouth daily. Started by: Hollice Espy, MD       Allergies:  Allergies  Allergen Reactions  . No Known Allergies     Family History: No family history on file.  Social History:  reports that he has never smoked. He has never used smokeless tobacco. He reports that he does not drink alcohol or use drugs.  ROS: UROLOGY Frequent  Urination?: Yes Hard to postpone urination?: No Burning/pain with urination?: No Get up at night to urinate?: Yes Leakage of urine?: Yes Urine stream starts and stops?: Yes Trouble starting stream?: Yes Do you have to strain to urinate?: Yes Blood in urine?: No Urinary tract infection?: No Sexually transmitted disease?: No Injury to kidneys or bladder?: No Painful intercourse?: No Weak stream?: Yes Erection problems?: No Penile pain?: No  Gastrointestinal Nausea?: No Vomiting?: No Indigestion/heartburn?: No Diarrhea?: No Constipation?: No  Constitutional Fever: No Night sweats?: No Weight loss?: No Fatigue?: No  Skin Skin rash/lesions?: No Itching?: No  Eyes Blurred vision?: No Double vision?: No  Ears/Nose/Throat Sore throat?: No Sinus problems?: No  Hematologic/Lymphatic Swollen glands?: No Easy bruising?: No  Cardiovascular Leg swelling?: No Chest pain?: No  Respiratory Cough?: No Shortness of breath?:  No  Endocrine Excessive thirst?: No  Musculoskeletal Back pain?: No Joint pain?: No  Neurological Headaches?: No Dizziness?: No  Psychologic Depression?: No Anxiety?: No  Physical Exam: BP (!) 165/95   Pulse (!) 54   Ht 5\' 10"  (1.778 m)   Wt 189 lb (85.7 kg)   BMI 27.12 kg/m   Constitutional:  Alert and oriented, No acute distress.  Left lower extremity surgically absent. HEENT: [place] AT, moist mucus membranes.  Trachea midline, no masses. Cardiovascular: No clubbing, cyanosis, or edema. Respiratory: Normal respiratory effort, no increased work of breathing. GI: Abdomen is soft, nontender, nondistended, no abdominal masses Rectal: Normal sphincter tone.  50 cc prostate, diffusely firm, no nodules, nontender. Skin: No rashes, bruises or suspicious lesions. Neurologic: Grossly intact, no focal deficits, moving all 4 extremities. Psychiatric: Normal mood and affect.  Laboratory Data: Lab Results  Component Value Date   WBC 6.0 07/12/2019   HGB 15.6 07/12/2019   HCT 44.0 07/12/2019   MCV 86.3 07/12/2019   PLT 205 07/12/2019    Lab Results  Component Value Date   CREATININE 1.00 09/20/2019    Urinalysis UA today negative  Pertinent Imaging: PVR 10 cc  Assessment & Plan:    1. Benign prostatic hyperplasia with weak urinary stream Prostamegaly with suspected prostatic regrowth following PVP in 2008 with progressively worsening urinary symptoms  Discussed pharmacotherapy versus surgical intervention for management.  He prefers to avoid medications as much as possible and would likely prefer to pursue a surgical approach.  We discussed that in selecting a surgical procedure, reevaluation of the prostatic anatomy as well as size will help guide this discussion.  In the interim, he is willing to try Flomax as a temporizing measure.  Possible side effects including orthostatic hypotension and retrograde ejaculation.  PSA screening not up-to-date  2.  Nocturia Behavioral modification discussed, understands that surgical intervention may or may not correct this issue - Urinalysis, Complete - Bladder Scan (Post Void Residual) in office  3. Post-void dribbling Haver modification discussed     Return for cysto/ TRUS next availible.  2009, MD  [facility] [address] [place], [place] [phone]

## 2019-11-09 ENCOUNTER — Encounter: Payer: Self-pay | Admitting: Urology

## 2019-11-16 ENCOUNTER — Other Ambulatory Visit: Payer: Medicare PPO | Admitting: Urology

## 2020-01-12 ENCOUNTER — Ambulatory Visit: Payer: Medicare PPO | Attending: Internal Medicine

## 2020-01-12 DIAGNOSIS — Z23 Encounter for immunization: Secondary | ICD-10-CM

## 2020-01-12 NOTE — Progress Notes (Signed)
   Covid-19 Vaccination Clinic  Name:  Peter Sutton    MRN: [id number] DOB: February 05, 1956  01/12/2020  [name] was observed post Covid-19 immunization for 15 minutes without incident. He was provided with Vaccine Information Sheet and instruction to access the V-Safe system.   [name] was instructed to call 911 with any severe reactions post vaccine: Marland Kitchen Difficulty breathing  . Swelling of face and throat  . A fast heartbeat  . A bad rash all over body  . Dizziness and weakness   Immunizations Administered    Name Date Dose VIS Date Route   Moderna COVID-19 Vaccine 01/12/2020 11:47 AM 0.5 mL 09/20/2019 Intramuscular   Manufacturer: Moderna   Lot: [id number]   NDC: 49324-199-14

## 2020-01-16 ENCOUNTER — Telehealth: Payer: Self-pay

## 2020-01-16 NOTE — Telephone Encounter (Signed)
Patient cancelled hep b will do after dose 2 covid vaccine Feb 21 2020.

## 2020-01-19 ENCOUNTER — Ambulatory Visit: Payer: Medicare PPO

## 2020-01-30 ENCOUNTER — Encounter: Payer: Self-pay | Admitting: Internal Medicine

## 2020-01-30 ENCOUNTER — Other Ambulatory Visit: Payer: Self-pay

## 2020-01-30 ENCOUNTER — Ambulatory Visit: Payer: Medicare PPO | Admitting: Internal Medicine

## 2020-01-30 VITALS — BP 168/90 | HR 50 | Temp 98.2°F | Ht 70.0 in | Wt 188.6 lb

## 2020-01-30 DIAGNOSIS — S81802A Unspecified open wound, left lower leg, initial encounter: Secondary | ICD-10-CM
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
## 2020-02-02 ENCOUNTER — Telehealth: Payer: Self-pay

## 2020-02-02 NOTE — Telephone Encounter (Signed)
Orders for wound vac sent to [facility] for home delivery- by 02/17/20( surgery on 02/22/20) Faxed to [facility] @ fax# [phone] Orders will be scanned into chart & to [facility] ( surgery coordinator) Copy sent to Dr. Dillingham/Matt, PA  Per pt- he does not need home health care for wound vac dressing changes- he indicated that he would be able to perform the necessary care if instructed how to do so.   we have arranged for [employer]- rep Shaneisa to come to the office for demonstration of wound vac care/dressing change - Pt will come @ 1pm on 02/07/20 prior to his preop appointment with [facility] @ 1:20pm & f/u visit with Dr. Mikey Bussing @ 2pm Pt understands the plan of care & will call for any concerns

## 2020-02-02 NOTE — Telephone Encounter (Signed)
AMENDMENT: faxed office notes with Dr. Mikey Bussing from 01/30/20 to Dr. Tonna Boehringer

## 2020-02-02 NOTE — Telephone Encounter (Signed)
Faxed office notes with Dr. Mikey Bussing (01/30/20) to Dr. Lequita Halt

## 2020-02-07 ENCOUNTER — Encounter: Payer: Medicare PPO | Admitting: Surgical

## 2020-02-07 ENCOUNTER — Ambulatory Visit (INDEPENDENT_AMBULATORY_CARE_PROVIDER_SITE_OTHER): Payer: Medicare PPO | Admitting: Surgical

## 2020-02-07 ENCOUNTER — Ambulatory Visit: Payer: Medicare PPO | Admitting: Internal Medicine

## 2020-02-07 ENCOUNTER — Telehealth: Payer: Self-pay

## 2020-02-07 ENCOUNTER — Encounter: Payer: Self-pay | Admitting: Surgical

## 2020-02-07 ENCOUNTER — Other Ambulatory Visit: Payer: Self-pay

## 2020-02-07 VITALS — BP 161/89 | HR 56 | Temp 98.2°F | Ht 70.0 in | Wt 185.0 lb

## 2020-02-07 DIAGNOSIS — S81802A Unspecified open wound, left lower leg, initial encounter: Secondary | ICD-10-CM

## 2020-02-07 MED ORDER — ONDANSETRON HCL 4 MG PO TABS: 4.0000 mg | ORAL_TABLET | Freq: Three times a day (TID) | ORAL | 0 refills | Status: DC | PRN

## 2020-02-07 MED ORDER — CEPHALEXIN 500 MG PO CAPS: 500.0000 mg | ORAL_CAPSULE | Freq: Four times a day (QID) | ORAL | 0 refills | Status: AC

## 2020-02-07 MED ORDER — HYDROCODONE-ACETAMINOPHEN 5-325 MG PO TABS: 1.0000 | ORAL_TABLET | Freq: Four times a day (QID) | ORAL | 0 refills | Status: AC | PRN

## 2020-02-07 NOTE — Progress Notes (Signed)
Patient ID: Peter Sutton, male    DOB: 29-Jun-1956, 64 y.o.   MRN: [id number]  Chief Complaint  Patient presents with  . Follow-up      ICD-10-CM   1. Leg wound, left, initial encounter  S81.802A     History of Present Illness: Peter Sutton is a 64 y.o.  male  with a history of left leg wound, he has had this wound for approximately 5 weeks.  He has had a wound in this area in the past He presents for preoperative evaluation for upcoming procedure, left thigh excision of wound, placement of celebrate or Integra bilayer, application of wound VAC, scheduled for 02/22/2020 with Dr. Marla Roe.  The patient has not had problems with anesthesia.  No history of DVT or PE, no family history of DVT or PE, not taking blood thinners, non-smoker, no cardiac or pulmonary diseases.  Reports he has been feeling well lately with no recent changes in his health status.  He has been seeing Dr. Heber [place] for wound care, they had previously ordered bandages for the patient to be delivered to his house, but he did not receive them, they are currently working on resolving this issue.  He has an additional small wound on his left upper thigh distal to the larger wound that we are currently treating, it is superficial and approximately 2 mm in diameter.  No periwound erythema.  No foul odor noted.  Patient reports that he has no assistance with transportation after surgery and may have to stay in hotel room postoperatively.  He lives in [place], approximately 30 minutes away.  Past Medical History: Allergies: Allergies  Allergen Reactions  . No Known Allergies     Current Medications:  Current Outpatient Medications:  .  tamsulosin (FLOMAX) 0.4 MG CAPS capsule, Take 1 capsule (0.4 mg total) by mouth daily., Disp: 30 capsule, Rfl: 11  Past Medical Problems: No past medical history on file.  Past Surgical History: Past Surgical History:  Procedure Laterality Date  . 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 LEG AMPUTATION BELOW KNEE      Social History: Social History   Socioeconomic History  . Marital status: Single    Spouse name: Not on file  . Number of children: Not on file  . Years of education: Not on file  . Highest education level: Not on file  Occupational History  . Not on file  Tobacco Use  . Smoking status: Never Smoker  . Smokeless tobacco: Never Used  Substance and Sexual Activity  . Alcohol use: Never  . Drug use: Never  . Sexual activity: Not on file  Other Topics Concern  . Not on file  Social History Narrative  . Not on file   Social Determinants of Health   Financial Resource Strain:   . Difficulty of Paying Living Expenses:   Food Insecurity:   . Worried About Charity fundraiser in the Last Year:   . Arboriculturist in the Last Year:   Transportation Needs:   . Film/video editor (Medical):   Marland Kitchen Lack of Transportation (Non-Medical):   Physical Activity:   . Days of Exercise per Week:   . Minutes of Exercise per Session:   Stress:   . Feeling of Stress :   Social Connections:   . Frequency of Communication with Friends and Family:   . Frequency of Social Gatherings with Friends and Family:   .  Attends Religious Services:   . Active Member of Clubs or Organizations:   . Attends Archivist Meetings:   Marland Kitchen Marital Status:   Intimate Partner Violence:   . Fear of Current or Ex-Partner:   . Emotionally Abused:   Marland Kitchen Physically Abused:   . Sexually Abused:     Family History: No family history on file.  Review of Systems: Review of Systems  Constitutional: Negative.   Respiratory: Negative.   Cardiovascular: Negative.   Gastrointestinal: Negative.     Physical Exam: Vital Signs BP (!) 161/89 (BP Location: Left Arm, Patient Position: Sitting, Cuff Size: Normal)   Pulse (!) 56   Temp 98.2 F (36.8 C) (Temporal)   Ht _0  (1.778 m)   Wt 185 lb (83.9 kg)   SpO2 96%   BMI  26.54 kg/m   Physical Exam Constitutional:      General: He is not in acute distress.    Appearance: Normal appearance. He is normal weight. He is not ill-appearing.  HENT:     Head: Normocephalic and atraumatic.  Cardiovascular:     Rate and Rhythm: Normal rate.     Heart sounds: Normal heart sounds. No murmur.  Pulmonary:     Effort: Pulmonary effort is normal.     Breath sounds: Normal breath sounds.  Musculoskeletal:     Cervical back: Normal range of motion.       Legs:  Skin:    General: Skin is warm and dry.  Neurological:     General: No focal deficit present.     Mental Status: He is alert and oriented to person, place, and time. Mental status is at baseline.  Psychiatric:        Mood and Affect: Mood normal.    Assessment/Plan: The patient is scheduled for excision of left thigh wound, placement of Cellerate or Integra bilayer, application of wound VAC with Dr. Marla Roe on 02/22/2020.  Risks, benefits, and alternatives of procedure discussed, questions answered and consent obtained.    Patient provided with general consent form covering surgical risks associated with surgical intervention. Patient was allocated adequate time prior to appointment to read through, initial and sign that they agree that they are aware of the risks associated. We also covered the risks in person during this pre-op appointment. I answered all of the patient's questions to their content and informed them to call with any further questions or concerns prior to surgery and I would be happy to discuss with them further.  Smoking status: Non-smoker Caprini score: 4, moderate, recommend mechanical prophylaxis intraoperatively, early ambulation.  Continue his dressing changes per Dr. Heber [place] recommendations.  I discussed with patient transportation needs may be able to be met by [facility]'s transportation services.  I will send in a request form to transportation.  I will update patient upon  approval or denial.  If patient is unable to be approved for transportation, he reports that he may spend the night in hotel room.    Electronically signed by: Carola Rhine Adlai Sinning, PA-C 02/07/2020 2:01 PM

## 2020-02-07 NOTE — Telephone Encounter (Signed)
Faxed order to [facility] for [facility].

## 2020-02-07 NOTE — H&P (View-Only) (Signed)
   Patient ID: Peter Sutton, male    DOB: 04/05/1956, 64 y.o.   MRN: [id number]  Chief Complaint  Patient presents with  . Follow-up      ICD-10-CM   1. Leg wound, left, initial encounter  S81.802A     History of Present Illness: Peter Sutton is a 64 y.o.  male  with a history of left leg wound, he has had this wound for approximately 5 weeks.  He has had a wound in this area in the past He presents for preoperative evaluation for upcoming procedure, left thigh excision of wound, placement of celebrate or Integra bilayer, application of wound VAC, scheduled for 02/22/2020 with Dr. Dillingham.  The patient has not had problems with anesthesia.  No history of DVT or PE, no family history of DVT or PE, not taking blood thinners, non-smoker, no cardiac or pulmonary diseases.  Reports he has been feeling well lately with no recent changes in his health status.  He has been seeing Dr. Hoffman for wound care, they had previously ordered bandages for the patient to be delivered to his house, but he did not receive them, they are currently working on resolving this issue.  He has an additional small wound on his left upper thigh distal to the larger wound that we are currently treating, it is superficial and approximately 2 mm in diameter.  No periwound erythema.  No foul odor noted.  Patient reports that he has no assistance with transportation after surgery and may have to stay in hotel room postoperatively.  He lives in [place], approximately 30 minutes away.  Past Medical History: Allergies: Allergies  Allergen Reactions  . No Known Allergies     Current Medications:  Current Outpatient Medications:  .  tamsulosin (FLOMAX) 0.4 MG CAPS capsule, Take 1 capsule (0.4 mg total) by mouth daily., Disp: 30 capsule, Rfl: 11  Past Medical Problems: No past medical history on file.  Past Surgical History: Past Surgical History:  Procedure Laterality Date  . 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 LEG AMPUTATION BELOW KNEE      Social History: Social History   Socioeconomic History  . Marital status: Single    Spouse name: Not on file  . Number of children: Not on file  . Years of education: Not on file  . Highest education level: Not on file  Occupational History  . Not on file  Tobacco Use  . Smoking status: Never Smoker  . Smokeless tobacco: Never Used  Substance and Sexual Activity  . Alcohol use: Never  . Drug use: Never  . Sexual activity: Not on file  Other Topics Concern  . Not on file  Social History Narrative  . Not on file   Social Determinants of Health   Financial Resource Strain:   . Difficulty of Paying Living Expenses:   Food Insecurity:   . Worried About Running Out of Food in the Last Year:   . Ran Out of Food in the Last Year:   Transportation Needs:   . Lack of Transportation (Medical):   . Lack of Transportation (Non-Medical):   Physical Activity:   . Days of Exercise per Week:   . Minutes of Exercise per Session:   Stress:   . Feeling of Stress :   Social Connections:   . Frequency of Communication with Friends and Family:   . Frequency of Social Gatherings with Friends and Family:   .   Attends Religious Services:   . Active Member of Clubs or Organizations:   . Attends Club or Organization Meetings:   . Marital Status:   Intimate Partner Violence:   . Fear of Current or Ex-Partner:   . Emotionally Abused:   . Physically Abused:   . Sexually Abused:     Family History: No family history on file.  Review of Systems: Review of Systems  Constitutional: Negative.   Respiratory: Negative.   Cardiovascular: Negative.   Gastrointestinal: Negative.     Physical Exam: Vital Signs BP (!) 161/89 (BP Location: Left Arm, Patient Position: Sitting, Cuff Size: Normal)   Pulse (!) 56   Temp 98.2 F (36.8 C) (Temporal)   Ht 5' 10" (1.778 m)   Wt 185 lb (83.9 kg)   SpO2 96%   BMI  26.54 kg/m   Physical Exam Constitutional:      General: He is not in acute distress.    Appearance: Normal appearance. He is normal weight. He is not ill-appearing.  HENT:     Head: Normocephalic and atraumatic.  Cardiovascular:     Rate and Rhythm: Normal rate.     Heart sounds: Normal heart sounds. No murmur.  Pulmonary:     Effort: Pulmonary effort is normal.     Breath sounds: Normal breath sounds.  Musculoskeletal:     Cervical back: Normal range of motion.       Legs:  Skin:    General: Skin is warm and dry.  Neurological:     General: No focal deficit present.     Mental Status: He is alert and oriented to person, place, and time. Mental status is at baseline.  Psychiatric:        Mood and Affect: Mood normal.    Assessment/Plan: The patient is scheduled for excision of left thigh wound, placement of Cellerate or Integra bilayer, application of wound VAC with Dr. Dillingham on 02/22/2020.  Risks, benefits, and alternatives of procedure discussed, questions answered and consent obtained.    Patient provided with general consent form covering surgical risks associated with surgical intervention. Patient was allocated adequate time prior to appointment to read through, initial and sign that they agree that they are aware of the risks associated. We also covered the risks in person during this pre-op appointment. I answered all of the patient's questions to their content and informed them to call with any further questions or concerns prior to surgery and I would be happy to discuss with them further.  Smoking status: Non-smoker Caprini score: 4, moderate, recommend mechanical prophylaxis intraoperatively, early ambulation.  Continue his dressing changes per Dr. Hoffman recommendations.  I discussed with patient transportation needs may be able to be met by [facility]'s transportation services.  I will send in a request form to transportation.  I will update patient upon  approval or denial.  If patient is unable to be approved for transportation, he reports that he may spend the night in hotel room.    Electronically signed by: Matthew J Scheeler, PA-C 02/07/2020 2:01 PM 

## 2020-02-14 ENCOUNTER — Other Ambulatory Visit: Payer: Self-pay

## 2020-02-14 ENCOUNTER — Encounter (HOSPITAL_BASED_OUTPATIENT_CLINIC_OR_DEPARTMENT_OTHER): Payer: Self-pay | Admitting: Plastic Surgery

## 2020-02-17 ENCOUNTER — Ambulatory Visit: Payer: Medicare PPO | Attending: Internal Medicine

## 2020-02-17 DIAGNOSIS — Z23 Encounter for immunization: Secondary | ICD-10-CM

## 2020-02-17 NOTE — Progress Notes (Signed)
   Covid-19 Vaccination Clinic  Name:  Peter Sutton    MRN: [id number] DOB: 22-Sep-1956  02/17/2020  [name] was observed post Covid-19 immunization for 15 minutes without incident. He was provided with Vaccine Information Sheet and instruction to access the V-Safe system.   [name] was instructed to call 911 with any severe reactions post vaccine: Marland Kitchen Difficulty breathing  . Swelling of face and throat  . A fast heartbeat  . A bad rash all over body  . Dizziness and weakness   Immunizations Administered    Name Date Dose VIS Date Route   Moderna COVID-19 Vaccine 02/17/2020 10:55 AM 0.5 mL 09/2019 Intramuscular   Manufacturer: Moderna   Lot: [id number]   NDC: 62836-629-47

## 2020-02-20 ENCOUNTER — Other Ambulatory Visit
Admission: RE | Admit: 2020-02-20 | Discharge: 2020-02-20 | Disposition: A | Discharge status: Home / Self Care | Payer: Medicare PPO | Source: Ambulatory Visit | Attending: Plastic Surgery | Admitting: Plastic Surgery

## 2020-02-20 DIAGNOSIS — Z01812 Encounter for preprocedural laboratory examination: Secondary | ICD-10-CM | POA: Diagnosis present

## 2020-02-20 DIAGNOSIS — Z20822 Contact with and (suspected) exposure to covid-19: Secondary | ICD-10-CM | POA: Diagnosis not present

## 2020-02-20 LAB — SARS CORONAVIRUS 2 (TAT 6-24 HRS): SARS Coronavirus 2: NEGATIVE

## 2020-02-21 ENCOUNTER — Ambulatory Visit: Payer: Medicare PPO

## 2020-02-21 ENCOUNTER — Telehealth: Payer: Self-pay | Admitting: Plastic Surgery

## 2020-02-21 NOTE — Telephone Encounter (Signed)
[facility] called to inform us that they have not been able to reach the patient, until they speak with him about his Out of pocket costs for the Wound Vac they are unable to deliver it to the patient. She is willing to deliver the device to the Surgery center for his case tomorrow if they are able to speak with him prior, Odessa Fleming can be reached at [phone]

## 2020-02-21 NOTE — Telephone Encounter (Signed)
[facility] called back, they have spoken to the patient and will deliver the Wound Vac to his house today

## 2020-02-22 ENCOUNTER — Ambulatory Visit (HOSPITAL_BASED_OUTPATIENT_CLINIC_OR_DEPARTMENT_OTHER): Payer: Medicare PPO | Admitting: Anesthesiology

## 2020-02-22 ENCOUNTER — Encounter (HOSPITAL_BASED_OUTPATIENT_CLINIC_OR_DEPARTMENT_OTHER): Payer: Self-pay | Admitting: Plastic Surgery

## 2020-02-22 ENCOUNTER — Encounter (HOSPITAL_BASED_OUTPATIENT_CLINIC_OR_DEPARTMENT_OTHER)
Admission: RE | Disposition: A | Discharge status: Home / Self Care | Payer: Self-pay | Source: Ambulatory Visit | Attending: Plastic Surgery

## 2020-02-22 ENCOUNTER — Other Ambulatory Visit: Payer: Self-pay

## 2020-02-22 ENCOUNTER — Observation Stay (HOSPITAL_BASED_OUTPATIENT_CLINIC_OR_DEPARTMENT_OTHER)
Admission: RE | Admit: 2020-02-22 | Discharge: 2020-02-23 | Disposition: A | Discharge status: Home / Self Care | Payer: Medicare PPO | Source: Ambulatory Visit | Attending: Plastic Surgery | Admitting: Plastic Surgery

## 2020-02-22 DIAGNOSIS — S81802A Unspecified open wound, left lower leg, initial encounter: Principal | ICD-10-CM | POA: Diagnosis present

## 2020-02-22 DIAGNOSIS — L989 Disorder of the skin and subcutaneous tissue, unspecified: Secondary | ICD-10-CM | POA: Diagnosis not present

## 2020-02-22 DIAGNOSIS — X58XXXA Exposure to other specified factors, initial encounter: Secondary | ICD-10-CM | POA: Insufficient documentation

## 2020-02-22 HISTORY — PX: APPLICATION OF WOUND VAC: SHX5189

## 2020-02-22 HISTORY — PX: INCISION AND DRAINAGE OF WOUND: SHX1803

## 2020-02-22 HISTORY — PX: APPLICATION OF A-CELL OF EXTREMITY: SHX6303

## 2020-02-22 SURGERY — IRRIGATION AND DEBRIDEMENT WOUND
Anesthesia: General | Site: Leg Upper | Laterality: Left

## 2020-02-22 MED ORDER — FENTANYL CITRATE (PF) 100 MCG/2ML IJ SOLN
INTRAMUSCULAR | Status: DC | PRN
  Administered 2020-02-22: 100 ug via INTRAVENOUS

## 2020-02-22 MED ORDER — PROPOFOL 10 MG/ML IV BOLUS
INTRAVENOUS | Status: DC | PRN
  Administered 2020-02-22: 200 mg via INTRAVENOUS

## 2020-02-22 MED ORDER — ONDANSETRON HCL 4 MG/2ML IJ SOLN
INTRAMUSCULAR | Status: AC
  Filled 2020-02-22: qty 2

## 2020-02-22 MED ORDER — FENTANYL CITRATE (PF) 100 MCG/2ML IJ SOLN
25.0000 ug | INTRAMUSCULAR | Status: DC | PRN
  Administered 2020-02-22 (×2): 50 ug via INTRAVENOUS

## 2020-02-22 MED ORDER — IBUPROFEN 200 MG PO TABS
400.0000 mg | ORAL_TABLET | Freq: Four times a day (QID) | ORAL | Status: DC
  Administered 2020-02-22 – 2020-02-23 (×5): 400 mg via ORAL
  Filled 2020-02-22 (×5): qty 2

## 2020-02-22 MED ORDER — POLYETHYLENE GLYCOL 3350 17 G PO PACK: 17.0000 g | PACK | Freq: Every day | ORAL | Status: DC | PRN

## 2020-02-22 MED ORDER — EPHEDRINE 5 MG/ML INJ
INTRAVENOUS | Status: AC
  Filled 2020-02-22: qty 10

## 2020-02-22 MED ORDER — EPHEDRINE SULFATE 50 MG/ML IJ SOLN
INTRAMUSCULAR | Status: DC | PRN
  Administered 2020-02-22: 10 mg via INTRAVENOUS

## 2020-02-22 MED ORDER — MIDAZOLAM HCL 2 MG/2ML IJ SOLN
INTRAMUSCULAR | Status: AC
  Filled 2020-02-22: qty 2

## 2020-02-22 MED ORDER — HYDROMORPHONE HCL 1 MG/ML IJ SOLN
1.0000 mg | INTRAMUSCULAR | Status: DC | PRN
  Administered 2020-02-22 – 2020-02-23 (×4): 1 mg via INTRAVENOUS
  Filled 2020-02-22 (×5): qty 1

## 2020-02-22 MED ORDER — BACITRACIN ZINC 500 UNIT/GM EX OINT
TOPICAL_OINTMENT | CUTANEOUS | Status: AC
  Filled 2020-02-22: qty 28.35

## 2020-02-22 MED ORDER — HYDROCODONE-ACETAMINOPHEN 5-325 MG PO TABS
1.0000 | ORAL_TABLET | ORAL | Status: DC | PRN
  Administered 2020-02-22 – 2020-02-23 (×6): 1 via ORAL
  Filled 2020-02-22 (×6): qty 1

## 2020-02-22 MED ORDER — CEFAZOLIN SODIUM-DEXTROSE 2-4 GM/100ML-% IV SOLN
2.0000 g | INTRAVENOUS | Status: AC
  Administered 2020-02-22: 2 g via INTRAVENOUS

## 2020-02-22 MED ORDER — OXYCODONE HCL 5 MG PO TABS: 5.0000 mg | ORAL_TABLET | Freq: Once | ORAL | Status: DC | PRN

## 2020-02-22 MED ORDER — DEXAMETHASONE SODIUM PHOSPHATE 4 MG/ML IJ SOLN
INTRAMUSCULAR | Status: DC | PRN
  Administered 2020-02-22: 5 mg via INTRAVENOUS

## 2020-02-22 MED ORDER — LIDOCAINE 2% (20 MG/ML) 5 ML SYRINGE
INTRAMUSCULAR | Status: AC
  Filled 2020-02-22: qty 5

## 2020-02-22 MED ORDER — LIDOCAINE-EPINEPHRINE 1 %-1:100000 IJ SOLN
INTRAMUSCULAR | Status: AC
  Filled 2020-02-22: qty 2

## 2020-02-22 MED ORDER — PHENYLEPHRINE 40 MCG/ML (10ML) SYRINGE FOR IV PUSH (FOR BLOOD PRESSURE SUPPORT)
PREFILLED_SYRINGE | INTRAVENOUS | Status: AC
  Filled 2020-02-22: qty 10

## 2020-02-22 MED ORDER — KCL IN DEXTROSE-NACL 20-5-0.45 MEQ/L-%-% IV SOLN
INTRAVENOUS | Status: DC
  Filled 2020-02-22: qty 1000

## 2020-02-22 MED ORDER — BUPIVACAINE HCL (PF) 0.25 % IJ SOLN
INTRAMUSCULAR | Status: AC
  Filled 2020-02-22: qty 30

## 2020-02-22 MED ORDER — ONDANSETRON 4 MG PO TBDP: 4.0000 mg | ORAL_TABLET | Freq: Four times a day (QID) | ORAL | Status: DC | PRN

## 2020-02-22 MED ORDER — SENNA 8.6 MG PO TABS
1.0000 | ORAL_TABLET | Freq: Two times a day (BID) | ORAL | Status: DC
  Administered 2020-02-22 – 2020-02-23 (×2): 8.6 mg via ORAL
  Filled 2020-02-22 (×2): qty 1

## 2020-02-22 MED ORDER — DIPHENHYDRAMINE HCL 50 MG/ML IJ SOLN: 12.5000 mg | Freq: Four times a day (QID) | INTRAMUSCULAR | Status: DC | PRN

## 2020-02-22 MED ORDER — DEXAMETHASONE SODIUM PHOSPHATE 10 MG/ML IJ SOLN
INTRAMUSCULAR | Status: AC
  Filled 2020-02-22: qty 1

## 2020-02-22 MED ORDER — ONDANSETRON HCL 4 MG/2ML IJ SOLN: 4.0000 mg | Freq: Four times a day (QID) | INTRAMUSCULAR | Status: DC | PRN

## 2020-02-22 MED ORDER — CEFAZOLIN SODIUM-DEXTROSE 2-4 GM/100ML-% IV SOLN
2.0000 g | Freq: Three times a day (TID) | INTRAVENOUS | Status: DC
  Administered 2020-02-22 – 2020-02-23 (×2): 2 g via INTRAVENOUS
  Filled 2020-02-22 (×2): qty 100

## 2020-02-22 MED ORDER — OXYCODONE HCL 5 MG/5ML PO SOLN: 5.0000 mg | Freq: Once | ORAL | Status: DC | PRN

## 2020-02-22 MED ORDER — ACETAMINOPHEN 325 MG PO TABS
325.0000 mg | ORAL_TABLET | Freq: Four times a day (QID) | ORAL | Status: DC
  Administered 2020-02-23: 325 mg via ORAL
  Filled 2020-02-22 (×3): qty 1

## 2020-02-22 MED ORDER — LACTATED RINGERS IV SOLN: INTRAVENOUS | Status: DC

## 2020-02-22 MED ORDER — CEFAZOLIN SODIUM-DEXTROSE 2-4 GM/100ML-% IV SOLN
INTRAVENOUS | Status: AC
  Filled 2020-02-22: qty 100

## 2020-02-22 MED ORDER — PROPOFOL 500 MG/50ML IV EMUL
INTRAVENOUS | Status: AC
  Filled 2020-02-22: qty 50

## 2020-02-22 MED ORDER — CHLORHEXIDINE GLUCONATE CLOTH 2 % EX PADS: 6.0000 | MEDICATED_PAD | Freq: Once | CUTANEOUS | Status: DC

## 2020-02-22 MED ORDER — DIPHENHYDRAMINE HCL 12.5 MG/5ML PO ELIX: 12.5000 mg | ORAL_SOLUTION | Freq: Four times a day (QID) | ORAL | Status: DC | PRN

## 2020-02-22 MED ORDER — SUCCINYLCHOLINE CHLORIDE 200 MG/10ML IV SOSY
PREFILLED_SYRINGE | INTRAVENOUS | Status: AC
  Filled 2020-02-22: qty 10

## 2020-02-22 MED ORDER — FENTANYL CITRATE (PF) 100 MCG/2ML IJ SOLN
INTRAMUSCULAR | Status: AC
  Filled 2020-02-22: qty 2

## 2020-02-22 MED ORDER — LIDOCAINE 2% (20 MG/ML) 5 ML SYRINGE
INTRAMUSCULAR | Status: DC | PRN
  Administered 2020-02-22: 60 mg via INTRAVENOUS

## 2020-02-22 MED ORDER — LIDOCAINE-EPINEPHRINE 1 %-1:100000 IJ SOLN
INTRAMUSCULAR | Status: DC | PRN
  Administered 2020-02-22: 10 mL

## 2020-02-22 MED ORDER — ONDANSETRON HCL 4 MG/2ML IJ SOLN
4.0000 mg | Freq: Once | INTRAMUSCULAR | Status: AC | PRN
  Administered 2020-02-22: 4 mg via INTRAVENOUS

## 2020-02-22 SURGICAL SUPPLY — 100 items
BAG DECANTER FOR FLEXI CONT (MISCELLANEOUS) ×1 IMPLANT
BENZOIN TINCTURE PRP APPL 2/3 (GAUZE/BANDAGES/DRESSINGS) IMPLANT
BLADE HEX COATED 2.75 (ELECTRODE) IMPLANT
BLADE MINI RND TIP GREEN BEAV (BLADE) IMPLANT
BLADE SURG 10 STRL SS (BLADE) IMPLANT
BLADE SURG 15 STRL LF DISP TIS (BLADE) ×1 IMPLANT
BLADE SURG 15 STRL SS (BLADE) ×2
BNDG COHESIVE 4X5 TAN STRL (GAUZE/BANDAGES/DRESSINGS) IMPLANT
BNDG ELASTIC 3X5.8 VLCR STR LF (GAUZE/BANDAGES/DRESSINGS) IMPLANT
BNDG ELASTIC 4X5.8 VLCR STR LF (GAUZE/BANDAGES/DRESSINGS) IMPLANT
BNDG ELASTIC 6X5.8 VLCR STR LF (GAUZE/BANDAGES/DRESSINGS) IMPLANT
BNDG GAUZE ELAST 4 BULKY (GAUZE/BANDAGES/DRESSINGS) ×1 IMPLANT
CANISTER SUCT 1200ML W/VALVE (MISCELLANEOUS) ×2 IMPLANT
CLOSURE WOUND 1/2 X4 (GAUZE/BANDAGES/DRESSINGS)
CORD BIPOLAR FORCEPS 12FT (ELECTRODE) IMPLANT
COVER BACK TABLE 60X90IN (DRAPES) ×3 IMPLANT
COVER MAYO STAND STRL (DRAPES) ×3 IMPLANT
COVER WAND RF STERILE (DRAPES) IMPLANT
DECANTER SPIKE VIAL GLASS SM (MISCELLANEOUS) IMPLANT
DERMABOND ADVANCED (GAUZE/BANDAGES/DRESSINGS)
DERMABOND ADVANCED .7 DNX12 (GAUZE/BANDAGES/DRESSINGS) IMPLANT
DRAIN CHANNEL 19F RND (DRAIN) IMPLANT
DRAIN PENROSE 1/2X12 LTX STRL (WOUND CARE) IMPLANT
DRAPE INCISE IOBAN 66X45 STRL (DRAPES) IMPLANT
DRAPE LAPAROSCOPIC ABDOMINAL (DRAPES) IMPLANT
DRAPE LAPAROTOMY 100X72 PEDS (DRAPES) IMPLANT
DRAPE U-SHAPE 76X120 STRL (DRAPES) ×5 IMPLANT
DRSG ADAPTIC 3X8 NADH LF (GAUZE/BANDAGES/DRESSINGS) IMPLANT
DRSG EMULSION OIL 3X3 NADH (GAUZE/BANDAGES/DRESSINGS) IMPLANT
DRSG HYDROCOLLOID 4X4 (GAUZE/BANDAGES/DRESSINGS) ×2 IMPLANT
DRSG PAD ABDOMINAL 8X10 ST (GAUZE/BANDAGES/DRESSINGS) IMPLANT
ELECT NDL TIP 2.8 STRL (NEEDLE) IMPLANT
ELECT NEEDLE TIP 2.8 STRL (NEEDLE) IMPLANT
ELECT REM PT RETURN 9FT ADLT (ELECTROSURGICAL) ×3
ELECTRODE REM PT RTRN 9FT ADLT (ELECTROSURGICAL) ×1 IMPLANT
EVACUATOR SILICONE 100CC (DRAIN) IMPLANT
GAUZE SPONGE 4X4 12PLY STRL (GAUZE/BANDAGES/DRESSINGS) ×3 IMPLANT
GAUZE SPONGE 4X4 12PLY STRL LF (GAUZE/BANDAGES/DRESSINGS) IMPLANT
GAUZE XEROFORM 1X8 LF (GAUZE/BANDAGES/DRESSINGS) IMPLANT
GAUZE XEROFORM 5X9 LF (GAUZE/BANDAGES/DRESSINGS) IMPLANT
GLOVE BIO SURGEON STRL SZ 6.5 (GLOVE) ×4 IMPLANT
GLOVE BIO SURGEON STRL SZ7 (GLOVE) ×2 IMPLANT
GLOVE BIO SURGEONS STRL SZ 6.5 (GLOVE) ×2
GLOVE BIOGEL PI IND STRL 7.0 (GLOVE) IMPLANT
GLOVE BIOGEL PI INDICATOR 7.0 (GLOVE) ×4
GOWN STRL REUS W/ TWL LRG LVL3 (GOWN DISPOSABLE) ×2 IMPLANT
GOWN STRL REUS W/TWL LRG LVL3 (GOWN DISPOSABLE) ×6
IV NS IRRIG 3000ML ARTHROMATIC (IV SOLUTION) IMPLANT
MANIFOLD NEPTUNE II (INSTRUMENTS) IMPLANT
MATRIX WOUND MESHED 2X2 (Tissue) IMPLANT
NDL HYPO 25X1 1.5 SAFETY (NEEDLE) IMPLANT
NDL PRECISIONGLIDE 27X1.5 (NEEDLE) IMPLANT
NEEDLE HYPO 25X1 1.5 SAFETY (NEEDLE) ×3 IMPLANT
NEEDLE PRECISIONGLIDE 27X1.5 (NEEDLE) IMPLANT
NS IRRIG 1000ML POUR BTL (IV SOLUTION) ×3 IMPLANT
PADDING CAST ABS 3INX4YD NS (CAST SUPPLIES)
PADDING CAST ABS 4INX4YD NS (CAST SUPPLIES)
PADDING CAST ABS COTTON 3X4 (CAST SUPPLIES) IMPLANT
PADDING CAST ABS COTTON 4X4 ST (CAST SUPPLIES) IMPLANT
PENCIL SMOKE EVACUATOR (MISCELLANEOUS) ×3 IMPLANT
PIN SAFETY STERILE (MISCELLANEOUS) IMPLANT
SET BASIN DAY SURGERY F.S. (CUSTOM PROCEDURE TRAY) ×3 IMPLANT
SHEET MEDIUM DRAPE 40X70 STRL (DRAPES) ×3 IMPLANT
SLEEVE SCD COMPRESS KNEE MED (MISCELLANEOUS) ×2 IMPLANT
SPLINT FIBERGLASS 3X35 (CAST SUPPLIES) ×1 IMPLANT
SPLINT FIBERGLASS 4X30 (CAST SUPPLIES) IMPLANT
SPLINT PLASTER CAST XFAST 3X15 (CAST SUPPLIES) IMPLANT
SPLINT PLASTER XTRA FASTSET 3X (CAST SUPPLIES)
SPONGE LAP 18X18 RF (DISPOSABLE) ×3 IMPLANT
STAPLER VISISTAT 35W (STAPLE) IMPLANT
STOCKINETTE 4X48 STRL (DRAPES) IMPLANT
STOCKINETTE 6  STRL (DRAPES)
STOCKINETTE 6 STRL (DRAPES) ×1 IMPLANT
STOCKINETTE IMPERVIOUS LG (DRAPES) IMPLANT
STRIP CLOSURE SKIN 1/2X4 (GAUZE/BANDAGES/DRESSINGS) IMPLANT
SUCTION FRAZIER HANDLE 10FR (MISCELLANEOUS)
SUCTION TUBE FRAZIER 10FR DISP (MISCELLANEOUS) IMPLANT
SURGILUBE 2OZ TUBE FLIPTOP (MISCELLANEOUS) IMPLANT
SUT MNCRL AB 4-0 PS2 18 (SUTURE) ×3 IMPLANT
SUT MON AB 3-0 SH 27 (SUTURE) ×2
SUT MON AB 3-0 SH27 (SUTURE) IMPLANT
SUT SILK 3 0 PS 1 (SUTURE) ×2 IMPLANT
SUT VIC AB 3-0 FS2 27 (SUTURE) IMPLANT
SUT VIC AB 5-0 P-3 18X BRD (SUTURE) IMPLANT
SUT VIC AB 5-0 P3 18 (SUTURE)
SUT VIC AB 5-0 PS2 18 (SUTURE) ×5 IMPLANT
SUT VICRYL 4-0 PS2 18IN ABS (SUTURE) IMPLANT
SWAB COLLECTION DEVICE MRSA (MISCELLANEOUS) IMPLANT
SWAB CULTURE ESWAB REG 1ML (MISCELLANEOUS) IMPLANT
SYR BULB IRRIG 60ML STRL (SYRINGE) ×3 IMPLANT
SYR CONTROL 10ML LL (SYRINGE) ×3 IMPLANT
TAPE HYPAFIX 6 X30' (GAUZE/BANDAGES/DRESSINGS)
TAPE HYPAFIX 6X30 (GAUZE/BANDAGES/DRESSINGS) IMPLANT
TOWEL GREEN STERILE FF (TOWEL DISPOSABLE) ×5 IMPLANT
TRAY DSU PREP LF (CUSTOM PROCEDURE TRAY) ×3 IMPLANT
TUBE CONNECTING 20'X1/4 (TUBING) ×1
TUBE CONNECTING 20X1/4 (TUBING) ×2 IMPLANT
UNDERPAD 30X36 HEAVY ABSORB (UNDERPADS AND DIAPERS) ×3 IMPLANT
WOUND MATRIX MESHED 2X2 (Tissue) ×2 IMPLANT
YANKAUER SUCT BULB TIP NO VENT (SUCTIONS) ×3 IMPLANT

## 2020-02-22 NOTE — Anesthesia Procedure Notes (Signed)
Procedure Name: LMA Insertion Date/Time: 02/22/2020 8:38 AM Performed by: Ronnette Hila, CRNA Pre-anesthesia Checklist: Patient identified, Emergency Drugs available, Suction available and Patient being monitored Patient Re-evaluated:Patient Re-evaluated prior to induction Oxygen Delivery Method: Circle system utilized Preoxygenation: Pre-oxygenation with 100% oxygen Induction Type: IV induction Ventilation: Mask ventilation without difficulty LMA: LMA inserted LMA Size: 4.0 Number of attempts: 1 Airway Equipment and Method: Bite block Placement Confirmation: positive ETCO2 Tube secured with: Tape Dental Injury: Teeth and Oropharynx as per pre-operative assessment

## 2020-02-22 NOTE — Interval H&P Note (Signed)
History and Physical Interval Note:  02/22/2020 7:59 AM  Peter Sutton  has presented today for surgery, with the diagnosis of left leg wound.  The various methods of treatment have been discussed with the patient and family. After consideration of risks, benefits and other options for treatment, the patient has consented to  Procedure(s) with comments: Left thigh excision of wound (Left) - total case 1 hour Placement of Cellerate or Integra Bilayer placement (Left) APPLICATION OF WOUND VAC (Left) as a surgical intervention.  The patient's history has been reviewed, patient examined, no change in status, stable for surgery.  I have reviewed the patient's chart and labs.  Questions were answered to the patient's satisfaction.     Alena Bills Noemi Bellissimo

## 2020-02-22 NOTE — Transfer of Care (Signed)
Immediate Anesthesia Transfer of Care Note  Patient: Peter Sutton  Procedure(s) Performed: Left thigh excision of wound (Left Leg Upper) Placement of Integra Bilayer placement (Left Leg Upper) APPLICATION OF WOUND VAC (Left Leg Upper)  Patient Location: PACU  Anesthesia Type:General  Level of Consciousness: awake, alert , oriented and drowsy  Airway & Oxygen Therapy: Patient Spontanous Breathing and Patient connected to face mask oxygen  Post-op Assessment: Report given to RN and Post -op Vital signs reviewed and stable  Post vital signs: Reviewed and stable  Last Vitals:  Vitals Value Taken Time  BP    Temp    Pulse    Resp    SpO2      Last Pain:  Vitals:   02/22/20 0811  TempSrc: Oral  PainSc: 0-No pain         Complications: No apparent anesthesia complications

## 2020-02-22 NOTE — Discharge Instructions (Signed)
Wound Care with Integra  Guide to Wound Care  Proper wound care may reduce the risk of infection, improve healing rates, and limit scarring.  This is a general guide to help care for and manage wounds treated with Integra Wound Matrix.   Dressing Changes The frequency of dressing changes can vary based on which product was applied, the size of the wound, or the amount of wound drainage. Dressing inspections are recommended, at least weekly.   If you have a Wound VAC it will be changed in one week after the first time it is applied.  Then it will be changed once or twice a week.   If you don't have a Wound VAC, then place KY gel on the wound daily and cover with gauze.  Dressing  1. Wash Hands - To help decrease the risk of infection, caregivers should wash their hands for a minimum of 20 seconds and may use medical gloves.   2. Remove the Dressings - Avoid removing product from the wound by carefully removing the applicable dressing(s) at the time points recommended above, or as recommended by the treating physician.  Expected Color and Odor:  It is entirely normal for the wound to have an unpleasant odor and to form a caramel-colored gel as the product absorbs into the wound. It is important to leave this gel on the wound site.  3. Clean the Wound - Use clean water or saline to gently rinse around the wound surface and remove any excess discharge that may be present on the wound. Do not wipe off any of the caramel-colored gel on the wound.   What to look out for: . Large or increased amount of drainage  . Surrounding skin has worsening redness or hot to touch  . Increased pain in or around the wound  . Flu-like symptoms, fatigue, decreased appetite, fever  . Hard, crusty wound surface with black or brown coloring  4. Apply New Dressings - Dressings should cover the entire wound and be suitable for maintaining a moist wound environment.  The Integra and silicone sheet covering should be  left in place.  New dressing should consist of KY Jelly to keep the wound moist and soft gauze secured with a wrap or tape.   Maintain a Hydrated Wound Area It is important to keep the wound area moist throughout the healing process. If the wound appears to be dry during dressing changes, select a dressing that will hydrate the wound and maintain that ideal moist environment. If you are unsure what to do, ask the treating physician.  Remodeling Process Every patient heals differently, and no two cases are the same. The size and location of the wound, product type and layering configurations, and general patient health all contribute to how quickly a wound will heal.  While many factors can influence the rate at which the product absorbs, the following can be used as a general guide.   THINGS TO DO: Refrain from smoking High protein diet with plenty of vegetables and some fruit  Limit simple processed carbohydrates and sugar Protect the wound from trauma Protect the dressing
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
## 2020-02-22 NOTE — Anesthesia Postprocedure Evaluation (Signed)
Anesthesia Post Note  Patient: Peter Sutton  Procedure(s) Performed: Left thigh excision of wound (Left Leg Upper) Placement of Integra Bilayer placement (Left Leg Upper) APPLICATION OF WOUND VAC (Left Leg Upper)     Patient location during evaluation: PACU Anesthesia Type: General Level of consciousness: awake and alert Pain management: pain level controlled Vital Signs Assessment: post-procedure vital signs reviewed and stable Respiratory status: spontaneous breathing, nonlabored ventilation, respiratory function stable and patient connected to nasal cannula oxygen Cardiovascular status: blood pressure returned to baseline and stable Postop Assessment: no apparent nausea or vomiting Anesthetic complications: no    Last Vitals:  Vitals:   02/22/20 0930 02/22/20 0945  BP: 135/70 132/71  Pulse: 88 77  Resp: 14 18  Temp:    SpO2: 95% 96%    Last Pain:  Vitals:   02/22/20 1000  TempSrc:   PainSc: 3     LLE Motor Response: Purposeful movement (02/22/20 1000) LLE Sensation: Full sensation (02/22/20 1000)          Jaqualin Serpa COKER

## 2020-02-22 NOTE — Anesthesia Preprocedure Evaluation (Signed)
Anesthesia Evaluation  Patient identified by MRN, date of birth, ID band Patient awake    Reviewed: Allergy & Precautions, NPO status , Patient's Chart, lab work & pertinent test results  Airway Mallampati: II  TM Distance: >3 FB Neck ROM: Full    Dental  (+) Teeth Intact, Dental Advisory Given   Pulmonary    breath sounds clear to auscultation       Cardiovascular  Rhythm:Regular Rate:Normal     Neuro/Psych    GI/Hepatic   Endo/Other    Renal/GU      Musculoskeletal   Abdominal   Peds  Hematology   Anesthesia Other Findings   Reproductive/Obstetrics                             Anesthesia Physical Anesthesia Plan  ASA: I  Anesthesia Plan: General   Post-op Pain Management:    Induction: Intravenous  PONV Risk Score and Plan: Ondansetron and Dexamethasone  Airway Management Planned: LMA  Additional Equipment:   Intra-op Plan:   Post-operative Plan:   Informed Consent: I have reviewed the patients History and Physical, chart, labs and discussed the procedure including the risks, benefits and alternatives for the proposed anesthesia with the patient or authorized representative who has indicated his/her understanding and acceptance.   Dental advisory given  Plan Discussed with: CRNA and Anesthesiologist  Anesthesia Plan Comments:         Anesthesia Quick Evaluation  

## 2020-02-23 DIAGNOSIS — S81802A Unspecified open wound, left lower leg, initial encounter: Secondary | ICD-10-CM | POA: Diagnosis not present

## 2020-02-23 LAB — SURGICAL PATHOLOGY

## 2020-02-23 MED ORDER — THROMBIN 5000 UNITS EX SOLR
CUTANEOUS | Status: AC
  Filled 2020-02-23: qty 5000

## 2020-02-23 MED ORDER — SILVER NITRATE-POT NITRATE 75-25 % EX MISC
CUTANEOUS | Status: AC
  Filled 2020-02-23: qty 10

## 2020-02-23 NOTE — Discharge Summary (Signed)
Physician Discharge Summary  Patient ID: Peter Sutton MRN: [id number] DOB/AGE: 24-May-1956 64 y.o.  Admit date: 02/22/2020 Discharge date: 02/23/2020  Admission Diagnoses: Left leg wound  Discharge Diagnoses:  Active Problems:   Leg wound, left   Discharged Condition: good  Hospital Course: Patient is a 64 year old male who presented to the [facility] day [facility] center for excision of left leg wound, placement of Integra, placement of wound VAC by Dr. Marla Roe.  Postoperatively patient did well. Throughout the night nursing staff noted that patient's wound VAC canister was filling up quicker than expected and noted some bloody drainage between epithelium and wound VAC tape.  Patient felt well, blood pressure was stable.  This a.m. on evaluation wound VAC canister was noted to have sanguinous fluid within the canister and sanguinous fluid between the wound VAC tape and skin.  Upon removal of wound vac, small perforating vessel was noted to be bleeding. Thrombin "snow" and silver nitrate was used to stop the small perforating vessel. Integra was removed prior and then replaced. Patient tolerated this well. Bleeding was noted to be stopped. New wound vac was placed, good seal noted.    Consults: None  Significant Diagnostic Studies: none  Treatments: IV hydration, ancef, ibuprofen  Discharge Exam: Blood pressure (!) 144/69, pulse 62, temperature 97.9 F (36.6 C), resp. rate 18, height 5\' 10"  (1.778 m), weight 83.9 kg, SpO2 94 %. 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 Wound vac in place on L thigh with sanguinous fluid between epithelium and vac tape. No peri-wound erythema. No foul odor. Incision/Wound: Integra in place.    Disposition: Discharge disposition: 01-Home or Self Care       Discharge  Instructions    Call MD for:  difficulty breathing, headache or visual disturbances   Complete by: As directed    Call MD for:  extreme fatigue   Complete by: As directed    Call MD for:  hives   Complete by: As directed    Call MD for:  persistant dizziness or light-headedness   Complete by: As directed    Call MD for:  persistant nausea and vomiting   Complete by: As directed    Call MD for:  redness, tenderness, or signs of infection (pain, swelling, redness, odor or green/yellow discharge around incision site)   Complete by: As directed    Call MD for:  severe uncontrolled pain   Complete by: As directed    Call MD for:  temperature >100.4   Complete by: As directed    Diet - low sodium heart healthy   Complete by: As directed    Increase activity slowly   Complete by: As directed      Allergies as of 02/23/2020      Reactions   No Known Allergies       Medication List    TAKE these medications   ondansetron 4 MG tablet Commonly known as: Zofran Take 1 tablet (4 mg total) by mouth every 8 (eight) hours as needed for nausea or vomiting.   tamsulosin 0.4 MG Caps capsule Commonly known as: Flomax Take 1 capsule (0.4 mg total) by mouth daily.      Follow-up Information    Dillingham, Loel Lofty, DO In 1 week.   Specialty: Plastic Surgery Contact information: [place]  [address] [phone]           [facility] [address] [place], [place] [address] [phone]  Signed: Kermit Balo Buffey Zabinski 02/23/2020, 10:05 AM

## 2020-02-24 ENCOUNTER — Encounter: Payer: Self-pay | Admitting: *Deleted

## 2020-02-27 ENCOUNTER — Encounter: Payer: Self-pay | Admitting: Plastic Surgery

## 2020-02-27 NOTE — Telephone Encounter (Signed)
Discussed with patient, coming in tomorrow for wound vac change. He is feeling well other than some slightly increased pain to surgical site.

## 2020-02-28 ENCOUNTER — Ambulatory Visit: Payer: Medicare PPO

## 2020-02-28 ENCOUNTER — Encounter: Payer: Self-pay | Admitting: Surgical

## 2020-02-28 ENCOUNTER — Ambulatory Visit (INDEPENDENT_AMBULATORY_CARE_PROVIDER_SITE_OTHER): Payer: Medicare PPO | Admitting: Surgical

## 2020-02-28 ENCOUNTER — Encounter: Payer: Self-pay | Admitting: Plastic Surgery

## 2020-02-28 ENCOUNTER — Other Ambulatory Visit: Payer: Self-pay

## 2020-02-28 VITALS — BP 137/93 | HR 70 | Temp 98.2°F | Ht 70.0 in | Wt 185.0 lb

## 2020-02-28 DIAGNOSIS — S81802D Unspecified open wound, left lower leg, subsequent encounter: Secondary | ICD-10-CM

## 2020-02-28 MED ORDER — HYDROCODONE-ACETAMINOPHEN 5-325 MG PO TABS: 1.0000 | ORAL_TABLET | Freq: Four times a day (QID) | ORAL | 0 refills | Status: DC | PRN

## 2020-02-28 NOTE — Addendum Note (Signed)
Addended byKeenan Bachelor on: 02/28/2020 10:12 AM   Modules accepted: Orders

## 2020-02-28 NOTE — Progress Notes (Signed)
Patient is a 64 year old male here for follow-up after excision of left leg wound, placement of Integra 02/22/2020 with Dr. Ulice Bold.  Patient is overall doing well today, he does have a little bit of skin irritation at the medial aspect of his left thigh wound due to wound VAC tape.  He is not having any fevers, chills, nausea, vomiting.   Dr. Ulice Bold and I were both present during today's evaluation. On exam left thigh wound has a little bit of periwound irritation due to wound VAC tape, he also has a little bit of a rash starting from the wound VAC tape at the medial aspect of his left thigh.  There is no purulence, foul odor.  Integra has incorporated, silicone sheet in place.  Good granulation tissue noted at base of wound.  Wound is approximately 5 x 5 cm.  No new epithelialization noted yet.  Donated ACell applied to left thigh wound, wound VAC reapplied with skin protective dressing at the wound edges to prevent further rash/breakdown. Prescription for pain medication sent to pharmacy for pain control at night. Call with any questions or concerns.  Patient is going to follow-up in 3 days for additional wound VAC change and further reevaluation.

## 2020-03-02 ENCOUNTER — Encounter: Payer: Self-pay | Admitting: Plastic Surgery

## 2020-03-02 ENCOUNTER — Ambulatory Visit (INDEPENDENT_AMBULATORY_CARE_PROVIDER_SITE_OTHER): Payer: Medicare PPO | Admitting: Plastic Surgery

## 2020-03-02 ENCOUNTER — Other Ambulatory Visit: Payer: Self-pay

## 2020-03-02 VITALS — BP 142/75 | HR 66 | Temp 98.6°F | Ht 70.0 in | Wt 185.0 lb

## 2020-03-02 DIAGNOSIS — S81802A Unspecified open wound, left lower leg, initial encounter: Secondary | ICD-10-CM

## 2020-03-02 NOTE — Progress Notes (Signed)
   Subjective:    Patient ID: Peter Sutton, male    DOB: [birth year], 64 y.o.   MRN: [id number]  The patient is a 64 year old for follow-up after undergoing excision of a left lateral leg wound.  He has been using the VAC.  The Integra is still in place.  There is no sign of infection.  There is a little bit of periwound irritation.     Review of Systems  Constitutional: Negative.   HENT: Negative.   Eyes: Negative.   Respiratory: Negative.   Cardiovascular: Negative.   Gastrointestinal: Negative.   Endocrine: Negative.   Genitourinary: Negative.   Musculoskeletal: Positive for gait problem.       Objective:   Physical Exam Vitals and nursing note reviewed.  Constitutional:      Appearance: Normal appearance.  HENT:     Head: Normocephalic and atraumatic.  Cardiovascular:     Rate and Rhythm: Normal rate.     Pulses: Normal pulses.  Pulmonary:     Effort: Pulmonary effort is normal.  Neurological:     General: No focal deficit present.     Mental Status: He is alert. Mental status is at baseline.  Psychiatric:        Mood and Affect: Mood normal.        Behavior: Behavior normal.        Thought Content: Thought content normal.        Assessment & Plan:     ICD-10-CM   1. Wound of left lower extremity, initial encounter  S81.802A     I would like to give him weekend holiday of the back to help calm down the periwound area.  The patient agrees with this plan.  Will will plan to see him early next week and put the VAC back on.

## 2020-03-05 ENCOUNTER — Encounter: Payer: Self-pay | Admitting: Surgical

## 2020-03-05 ENCOUNTER — Ambulatory Visit (INDEPENDENT_AMBULATORY_CARE_PROVIDER_SITE_OTHER): Payer: Medicare PPO | Admitting: Surgical

## 2020-03-05 ENCOUNTER — Other Ambulatory Visit: Payer: Self-pay

## 2020-03-05 VITALS — BP 152/88 | HR 60 | Temp 98.3°F

## 2020-03-05 DIAGNOSIS — S81802D Unspecified open wound, left lower leg, subsequent encounter: Secondary | ICD-10-CM | POA: Diagnosis not present

## 2020-03-05 NOTE — Progress Notes (Signed)
   Subjective:     Patient ID: Peter Sutton, male    DOB: 03/02/1956, 64 y.o.   MRN: [id number]  Chief Complaint  Patient presents with  . Follow-up    HPI: The patient is a 64 y.o. male here for follow-up after excision of left thigh wound and placement of Integra on 02/22/2020 with Dr. Ulice Bold.  Patient was last seen on 03/02/2020 and had some periwound irritation from the wound VAC, it was decided to take a holiday from the wound VAC.  Peter Sutton is here today for reevaluation and possible replacement of wound VAC.  Peter Sutton is overall doing well today, left thigh is less irritated.  Peter Sutton is not having any fevers or chills.  Dressing changes were a lot easier without the wound VAC.  Review of Systems  Constitutional: Negative.   Skin: Positive for wound. Negative for color change.     Objective:   Vital Signs BP (!) 152/88 (BP Location: Left Arm, Patient Position: Sitting, Cuff Size: Large)   Pulse 60   Temp 98.3 F (36.8 C) (Temporal)   SpO2 98%  Vital Signs and Nursing Note Reviewed  Physical Exam  Constitutional: Peter Sutton is oriented to person, place, and time and well-developed, well-nourished, and in no distress.  Pulmonary/Chest: Effort normal.  Musculoskeletal:        General: Tenderness present.       Legs:     Comments: Left thigh wound with base of granular tissue noted, Integra sheet has partially incorporated, some of the Integra sheet may have fallen off.  There is no periwound erythema, previous rash has improved and is almost resolved.  There is no foul odor.  No purulent drainage.  There is some fibrinous exudate within the anterior portion of the wound.  No necrosis noted.  Wound today is approximately 5 x 5 cm.  Neurological: Peter Sutton is alert and oriented to person, place, and time.  Skin: Skin is warm and dry. No erythema.  Psychiatric: Mood and affect normal.     Assessment/Plan:     ICD-10-CM   1. Leg wound, left, subsequent encounter  S81.802D    Peter Sutton is  overall doing well, his left thigh irritation is improving after the wound VAC holiday.  There is no sign of any infection.  Peter Sutton has new granulation tissue noted.  Integra has either completely incorporated or had been accidentally removed with a dressing change, it is difficult to determine.  Donated ACell powder and she applied to left thigh wound today, Adaptic and K-Y jelly followed by Mepilex border dressing applied to left thigh.  Recommend applying K-Y jelly daily and changing Adaptic mesh on Thursday afternoon or Friday a.m.  Then recommend changing Adaptic every other day and applying K-Y jelly daily.  We had previously ordered patient some Mepilex border dressings, but unfortunately due to the musculature and anatomy of his thigh a larger wound dressing is necessary.  We will order him some new supplies.  Patient is going to follow-up in 1 week for reevaluation.  Please call with any questions or concerns or if Peter Sutton develops any infectious symptoms or has any concern for infection.  Pictures were obtained of the patient and placed in the chart with the patient's or guardian's permission.    Kermit Balo Gean Larose, PA-C 03/05/2020, 2:56 PM

## 2020-03-06 ENCOUNTER — Telehealth: Payer: Self-pay

## 2020-03-06 NOTE — Telephone Encounter (Signed)
Orders for wound/dressing faxed to [facility] per [place], PA Copy scanned into chart

## 2020-03-07 ENCOUNTER — Telehealth: Payer: Self-pay

## 2020-03-07 NOTE — Telephone Encounter (Signed)
Order from [facility]- confirmed via fax Copy scanned into chart

## 2020-03-08 ENCOUNTER — Telehealth: Payer: Self-pay

## 2020-03-08 ENCOUNTER — Ambulatory Visit: Payer: Medicare PPO | Admitting: Plastic Surgery

## 2020-03-08 ENCOUNTER — Encounter: Payer: Self-pay | Admitting: Plastic Surgery

## 2020-03-08 NOTE — Telephone Encounter (Signed)
Called [facility], spoke with [facility]. Patient has Medicare and they only pay for 12 mepilex in 30 days. I asked Amber to give patient a call and advise him the situation and to offer more product at a discount price.

## 2020-03-09 ENCOUNTER — Telehealth: Payer: Self-pay

## 2020-03-09 ENCOUNTER — Encounter: Payer: Self-pay | Admitting: Plastic Surgery

## 2020-03-09 ENCOUNTER — Telehealth: Payer: Self-pay | Admitting: Plastic Surgery

## 2020-03-09 NOTE — Telephone Encounter (Signed)
Call to pt - no answer- left v/m  re: his concern with the wound & also with not obtaining dressing supplies from [facility] I consulted with Dr. Ulice Bold with his concerns & she reviewed the photo he sent through his chart. Dr. Ulice Bold confirmed that the wound looked like it was ok & was normal in appearance for the wound management process. I informed the pt of this via v/m & directed him to call if any changes occur or if he has complications. he has f/u visit on Mon. 03/12/20  I also left message re: [facility] order status

## 2020-03-09 NOTE — Telephone Encounter (Signed)
Error

## 2020-03-12 ENCOUNTER — Ambulatory Visit (INDEPENDENT_AMBULATORY_CARE_PROVIDER_SITE_OTHER): Payer: Medicare PPO | Admitting: Surgical

## 2020-03-12 ENCOUNTER — Encounter: Payer: Self-pay | Admitting: Surgical

## 2020-03-12 ENCOUNTER — Other Ambulatory Visit: Payer: Self-pay

## 2020-03-12 VITALS — BP 152/91 | HR 70 | Temp 97.8°F | Ht 70.0 in | Wt 185.0 lb

## 2020-03-12 DIAGNOSIS — S81802D Unspecified open wound, left lower leg, subsequent encounter: Secondary | ICD-10-CM | POA: Diagnosis not present

## 2020-03-12 DIAGNOSIS — S81802A Unspecified open wound, left lower leg, initial encounter: Secondary | ICD-10-CM | POA: Diagnosis not present

## 2020-03-12 NOTE — Progress Notes (Signed)
   Subjective:     Patient ID: Peter Sutton, male    DOB: 1956-02-18, 64 y.o.   MRN: [id number]  Chief Complaint  Patient presents with  . Follow-up    Open wound of the left leg    HPI: The patient is a 64 y.o. male here for follow-up after undergoing excision of left lateral leg wound with Dr. Ulice Bold.  He is overall doing well today, reports that he received some wound care supplies from [facility].  He has been changing the dressings daily, reports a lot of drainage that has been soaking the 2 x 2 gauze daily.  He has not noticed any abnormal foul odors.  He has not had any fevers.   Review of Systems  Constitutional: Positive for activity change. Negative for fever.  Skin: Positive for wound.     Objective:   Vital Signs BP (!) 152/91 (BP Location: Right Arm, Patient Position: Sitting, Cuff Size: Large)   Pulse 70   Temp 97.8 F (36.6 C) (Temporal)   Ht 5\' 10"  (1.778 m)   Wt 185 lb (83.9 kg)   SpO2 96%   BMI 26.54 kg/m  Vital Signs and Nursing Note Reviewed Physical Exam  Constitutional: He is well-developed, well-nourished, and in no distress. No distress.  Neurological: He is alert.  Skin: Skin is warm and dry.     Psychiatric: Mood and affect normal.      Assessment/Plan:     ICD-10-CM   1. Leg wound, left, subsequent encounter  S81.802D   2. Wound of left lower extremity, initial encounter  S81.802A     Donated ACell applied to left thigh wound, recommend changing daily with K-Y jelly followed by gauze and Mepilex border dressing.  Wound appears to be incorporating ACell nicely and has good granulation tissue noted on exam.  No sign of infection.  Follow-up in 1 week, call with any questions or concerns.  Pictures were obtained of the patient and placed in the chart with the patient's or guardian's permission.   05-06-2003 Anamarie Hunn, PA-C 03/12/2020, 1:47 PM

## 2020-03-16 ENCOUNTER — Encounter: Payer: Medicare PPO | Admitting: Surgical

## 2020-03-19 ENCOUNTER — Encounter: Payer: Self-pay | Admitting: Plastic Surgery

## 2020-03-20 ENCOUNTER — Other Ambulatory Visit: Payer: Self-pay

## 2020-03-20 ENCOUNTER — Encounter: Payer: Self-pay | Admitting: Plastic Surgery

## 2020-03-20 ENCOUNTER — Ambulatory Visit (INDEPENDENT_AMBULATORY_CARE_PROVIDER_SITE_OTHER): Payer: Medicare PPO | Admitting: Plastic Surgery

## 2020-03-20 ENCOUNTER — Telehealth: Payer: Self-pay

## 2020-03-20 VITALS — BP 166/81 | HR 59 | Temp 97.5°F | Ht 70.0 in | Wt 185.0 lb

## 2020-03-20 DIAGNOSIS — S81802A Unspecified open wound, left lower leg, initial encounter: Secondary | ICD-10-CM

## 2020-03-20 NOTE — Telephone Encounter (Signed)
Patient requested 4x4s gauze. Called [facility] and spoke with Miranda. Placed a verbal order for 4x4 gauze.

## 2020-03-20 NOTE — Progress Notes (Signed)
   Subjective:    Patient ID: Peter Sutton, male    DOB: 05/27/1956, 64 y.o.   MRN: [id number]  The patient is a 64 yrs old wm here for follow up on his left leg wound.  He underwent excision of the area with integra placement.  He is showing nice signs of granulation tissue.  It is slow but this was expected.  There is no sign of infection.       Review of Systems  Constitutional: Negative.   HENT: Negative.   Eyes: Negative.   Respiratory: Negative.   Cardiovascular: Negative.   Gastrointestinal: Negative.   Endocrine: Negative.   Genitourinary: Negative.   Musculoskeletal: Negative.        Objective:   Physical Exam Vitals and nursing note reviewed.  Constitutional:      Appearance: Normal appearance.  HENT:     Head: Normocephalic and atraumatic.  Cardiovascular:     Rate and Rhythm: Normal rate.     Pulses: Normal pulses.  Pulmonary:     Effort: Pulmonary effort is normal.  Musculoskeletal:       Legs:  Neurological:     General: No focal deficit present.     Mental Status: He is alert and oriented to person, place, and time.  Psychiatric:        Mood and Affect: Mood normal.        Behavior: Behavior normal.        Assessment & Plan:     ICD-10-CM   1. Wound of left lower extremity, initial encounter  S81.802A     Donated acell was applied (powder and sheet).  I would like to see him back in one week.   Apply ky gel daily as needed to the acell.  Try to keep the dressing on from today for 2 days.   Pictures were obtained of the patient and placed in the chart with the patient's or guardian's permission.

## 2020-03-21 ENCOUNTER — Encounter: Payer: Self-pay | Admitting: Plastic Surgery

## 2020-03-21 NOTE — Telephone Encounter (Signed)
Will see patient Friday at 1:40. Called to inform. He believes the Acell sheet has come off due to so much drainage. Reviewed photo.

## 2020-03-22 ENCOUNTER — Encounter: Payer: Self-pay | Admitting: Surgical

## 2020-03-22 ENCOUNTER — Ambulatory Visit (INDEPENDENT_AMBULATORY_CARE_PROVIDER_SITE_OTHER): Payer: Medicare PPO | Admitting: Surgical

## 2020-03-22 ENCOUNTER — Other Ambulatory Visit: Payer: Self-pay

## 2020-03-22 VITALS — BP 159/81 | HR 72 | Temp 97.7°F

## 2020-03-22 DIAGNOSIS — S81802D Unspecified open wound, left lower leg, subsequent encounter: Secondary | ICD-10-CM | POA: Diagnosis not present

## 2020-03-22 MED ORDER — TRIAMCINOLONE ACETONIDE 0.025 % EX OINT: 1.0000 "application " | TOPICAL_OINTMENT | Freq: Every day | CUTANEOUS | 0 refills | Status: AC

## 2020-03-22 NOTE — Progress Notes (Signed)
   Subjective:     Patient ID: Peter Sutton, male    DOB: 02/14/1956, 64 y.o.   MRN: [id number]  Chief Complaint  Patient presents with  . Follow-up    for (L) leg wound    HPI: The patient is a 64 y.o. male here for follow-up on his left leg wound.  He reports an increased amount of drainage and difficulty keeping the bandage on due to the drainage.  He is also having some difficulty with dressing changes due to the skin/musculature moving with ambulation.  He also has a little bit of a rash around the wound.  There is no sign of any infection.  Review of Systems  Constitutional: Positive for activity change.  Cardiovascular: Negative.   Skin: Positive for rash and wound.     Objective:   Vital Signs BP (!) 159/81 (BP Location: Left Arm, Patient Position: Sitting, Cuff Size: Normal)   Pulse 72   Temp 97.7 F (36.5 C) (Temporal)   SpO2 97%  Vital Signs and Nursing Note Reviewed  Physical Exam  Constitutional: He is oriented to person, place, and time and well-developed, well-nourished, and in no distress.  Pulmonary/Chest: Effort normal.  Musculoskeletal:     Left upper leg: No swelling or edema.       Legs:  Neurological: He is alert and oriented to person, place, and time.  Skin: Skin is warm and dry. He is not diaphoretic.  Psychiatric: Mood and affect normal.   Wound: Measurements are approximately 5 x 5 cm with slight hyper granulation noted.  He does have a dynamic wound bed that moves with ambulation.   Assessment/Plan:     ICD-10-CM   1. Leg wound, left, subsequent encounter  S81.802D     Overall the base of the wound is doing well with good granulation tissue noted.  He has not had any epithelialization at the edges.  The drainage has increased per the patient and it has been difficult to keep the dressing on.  He has done well with 8 x 8 cm Mepilex border dressings.  Recommend daily dressing changes with silver alginate followed by 2 x 2 gauze, Mepilex  border dressing.  Patient is currently waiting for dressing supplies in the mail.  Discussed with patient that the alginate will help with excessive drainage.  Low potency steroid cream ordered for patient to apply to the periwound area due to the irritation/rash from continuous drainage.  Patient has a follow-up appointment next week for reevaluation.  Recommend calling with any questions or concerns.  Pictures were obtained of the patient and placed in the chart with the patient's or guardian's permission.   Kermit Balo Janene Yousuf, PA-C 03/22/2020, 2:10 PM

## 2020-03-23 MED ORDER — TRAMADOL HCL 50 MG PO TABS: 50.0000 mg | ORAL_TABLET | Freq: Three times a day (TID) | ORAL | 0 refills | Status: AC | PRN

## 2020-03-23 NOTE — Telephone Encounter (Signed)
Discussed with patient that we can see him on Monday, he has an appointment at 1:40 PM.  He reports that doing the dressing changes with Kerlix may have been a little bit helpful, he is hopeful that it will be more beneficial.  In regards to his pain, discussed this with Dr. Ulice Bold and we can try tramadol to assist with pain at night.  He reports the pain is mostly a stinging and burning pain that is worse at night.

## 2020-03-26 ENCOUNTER — Ambulatory Visit (INDEPENDENT_AMBULATORY_CARE_PROVIDER_SITE_OTHER): Payer: Medicare PPO | Admitting: Surgical

## 2020-03-26 ENCOUNTER — Encounter: Payer: Self-pay | Admitting: Surgical

## 2020-03-26 ENCOUNTER — Other Ambulatory Visit: Payer: Self-pay

## 2020-03-26 VITALS — BP 160/86 | HR 55 | Temp 98.0°F | Ht 70.0 in | Wt 185.0 lb

## 2020-03-26 DIAGNOSIS — S81802D Unspecified open wound, left lower leg, subsequent encounter: Secondary | ICD-10-CM | POA: Diagnosis not present

## 2020-03-26 NOTE — Progress Notes (Signed)
   Subjective:     Patient ID: Peter Sutton, male    DOB: [birth year], 64 y.o.   MRN: [id number]  Chief Complaint  Patient presents with  . Follow-up    1 week for (L) leg wound    HPI: The patient is a 64 y.o. male here for follow-up on his left leg wound.  Drainage has improved after beginning calcium alginate dressing changes.  Reports that he has sometimes doubled up the alginate to assist with increased drainage.  He reports pain has been a little bit better with pain medication.  Rash around the wound has improved with steroid cream, he reports he applied it 3 times yesterday.  He is hopeful that we can heal this wound up quickly as he would like to get back to normal daily activities.  He is very active.  Review of Systems  Constitutional: Positive for activity change.  Respiratory: Negative.   Cardiovascular: Negative.   Musculoskeletal: Positive for myalgias.  Skin: Positive for rash (Improving) and wound.     Objective:   Vital Signs BP (!) 160/86 (BP Location: Left Arm, Patient Position: Sitting, Cuff Size: Large)   Pulse (!) 55   Temp 98 F (36.7 C) (Temporal)   Ht 5\' 10"  (1.778 m)   Wt 185 lb (83.9 kg)   SpO2 97%   BMI 26.54 kg/m  Vital Signs and Nursing Note Reviewed  Physical Exam  Constitutional: He is oriented to person, place, and time.  Musculoskeletal:       Legs:  Neurological: He is alert and oriented to person, place, and time.      Assessment/Plan:     ICD-10-CM   1. Leg wound, left, subsequent encounter  S81.802D     Discussed with patient that this may take some time to heal, due to the dynamic movement of his leg disrupting the wound bed it is causing a little bit of a delay.  Discussed with patient that the wound bed appears healthy and I do not see any signs of any concern.  Recommend continuing with calcium alginate dressing changes daily.  We had previously ordered supplies for Peter Sutton at his last visit on 03/22/2020,  unfortunately he has not received them yet.  We will check in on this today.  I am hopeful that the wound will begin epithelializing soon, but the edges of the wound are slightly rolled which may delay healing.  Recommend following up in 1 week.  Call with any questions or concerns  Pictures were obtained of the patient and placed in the chart with the patient's or guardian's permission.    05/22/2020 Peter Ostermiller, PA-C 03/26/2020, 9:02 PM

## 2020-03-27 ENCOUNTER — Telehealth: Payer: Self-pay

## 2020-03-27 ENCOUNTER — Ambulatory Visit: Payer: Medicare PPO | Admitting: Surgical

## 2020-03-27 NOTE — Telephone Encounter (Signed)
Called [facility], spoke to Peter Sutton. Patient had 12 Mepilex sent to him on 03/22/20. He is eligible to order more on 04/20/20. However, they will call him and offer more at a discount price. Also ordered calcium alginate as well. Both are to be changed daily

## 2020-03-30 ENCOUNTER — Encounter: Payer: Self-pay | Admitting: Plastic Surgery

## 2020-04-02 ENCOUNTER — Ambulatory Visit: Payer: Medicare PPO | Admitting: Surgical

## 2020-04-03 ENCOUNTER — Encounter: Payer: Self-pay | Admitting: Surgical

## 2020-04-03 ENCOUNTER — Ambulatory Visit (INDEPENDENT_AMBULATORY_CARE_PROVIDER_SITE_OTHER): Payer: Medicare PPO | Admitting: Surgical

## 2020-04-03 ENCOUNTER — Telehealth: Payer: Self-pay | Admitting: *Deleted

## 2020-04-03 ENCOUNTER — Other Ambulatory Visit: Payer: Self-pay

## 2020-04-03 VITALS — BP 167/90 | HR 69 | Temp 97.5°F

## 2020-04-03 DIAGNOSIS — S81802D Unspecified open wound, left lower leg, subsequent encounter: Secondary | ICD-10-CM

## 2020-04-03 NOTE — Telephone Encounter (Signed)
Called [facility] and spoke with Revonda Standard and gave her a new order for Silver Collagen to be used daily.//AB/CMA

## 2020-04-03 NOTE — Progress Notes (Signed)
   Subjective:     Patient ID: Peter Sutton, male    DOB: 1956/09/25, 64 y.o.   MRN: [id number]  Chief Complaint  Patient presents with  . Follow-up    HPI: The patient is a 64 y.o. male here for follow-up on his left leg wound. He previously underwent debridement and placement of integra. He had donated Acell applied in the office multiple times to promote growth of granulation tissue. After he had a good base of granular tissue, he noted increased drainage to the point of saturating the gauze fairly quickly and it was decided to switch to calcium alginate to control increased drainage. This had been very helpful and Mr. Miranda reported a noticeable difference in the drainage.  He reports that on Friday he noticed an increased in amount of drainage and is concerned and for infection.  Other than the increased drainage he is feeling well.  He periwound area does not appear infected or irritated.  He is planning to possibly visit his daughter next week.  Review of Systems  Constitutional: Positive for activity change.  Skin: Positive for wound. Negative for color change and pallor.     Objective:   Vital Signs BP (!) 167/90 (BP Location: Left Arm, Patient Position: Sitting, Cuff Size: Large)   Pulse 69   Temp (!) 97.5 F (36.4 C) (Temporal)   SpO2 97%  Vital Signs and Nursing Note Reviewed  Physical Exam HENT:     Head: Normocephalic and atraumatic.  Pulmonary:     Effort: Pulmonary effort is normal.  Skin:    General: Skin is warm and dry.       Neurological:     Mental Status: He is alert.  Psychiatric:        Mood and Affect: Mood normal.        Behavior: Behavior normal.    Wound size is approximately 5 x 5 cm.   Assessment/Plan:     ICD-10-CM   1. Leg wound, left, subsequent encounter  S81.802D     Recommend continuing with calcium alginate at this time, will order collagen to assist with decreasing hyper granulation and helping with epithelialization.  If  this is unsuccessful, may need to do an excision in the office to decrease hypergranulation and allow the epithelium to bridge across the wound.  He may also need the wound edges cleaned up due to some rolled edges.  There is no sign of any infection.  We discussed the role of antibiotics is not necessary at this time due to no signs of any infection.  If he notices any changes, foul odor, purulence, increased redness or pain please call and we can further discuss the use of antibiotics.  Follow-up for reevaluation in 2 weeks, collagen dressing changes daily or every other day depending on drainage and soil level.  We will plan for excision in the office in 3 weeks unless there is a significant improvement prior to then.  Pictures were obtained of the patient and placed in the chart with the patient's or guardian's permission.   Kermit Balo Sergio Hobart, PA-C 04/03/2020, 2:16 PM

## 2020-04-11 ENCOUNTER — Telehealth: Payer: Self-pay

## 2020-04-11 NOTE — Telephone Encounter (Signed)
Natalia Leatherwood from [facility] called requesting wound measurements for patient from 05/21//2021 - 03/23/2020. Fax: [phone]

## 2020-04-12 ENCOUNTER — Other Ambulatory Visit: Payer: Self-pay

## 2020-04-12 DIAGNOSIS — B182 Chronic viral hepatitis C: Secondary | ICD-10-CM

## 2020-04-12 NOTE — Telephone Encounter (Signed)
Faxed wound measurement from (03/09/20- 03/23/20) to [facility] w/[employer].  Confirmation received.//AB/CMA  Wound measurement:Wound vac was taken off on  (03/02/20).  Wound measurement:(03/20/20)-(5x5cm)  Wound measurement:(03/22/20)-(5x5cm)

## 2020-04-17 ENCOUNTER — Ambulatory Visit: Payer: Medicare PPO | Admitting: Surgical

## 2020-04-18 MED ORDER — DOXYCYCLINE HYCLATE 100 MG PO TABS: 100.0000 mg | ORAL_TABLET | Freq: Two times a day (BID) | ORAL | 0 refills | Status: AC

## 2020-04-19 ENCOUNTER — Telehealth: Payer: Self-pay | Admitting: *Deleted

## 2020-04-19 ENCOUNTER — Ambulatory Visit: Payer: Medicare PPO

## 2020-04-19 NOTE — Telephone Encounter (Signed)
Received Request for Discharge Orders via of fax on (04/16/20) from [facility]. Requesting to be signed and return.  Given to provider to complete.   Orders signed and faxed to [facility].  Confirmation received.  Copy scanned into the chart.//AB/CMA

## 2020-04-20 ENCOUNTER — Ambulatory Visit: Payer: Medicare PPO | Admitting: Surgical

## 2020-04-24 ENCOUNTER — Ambulatory Visit: Payer: Medicare PPO | Attending: Infectious Diseases | Admitting: Infectious Diseases

## 2020-04-24 ENCOUNTER — Other Ambulatory Visit: Payer: Self-pay

## 2020-04-24 ENCOUNTER — Telehealth: Payer: Self-pay | Admitting: Plastic Surgery

## 2020-04-24 ENCOUNTER — Other Ambulatory Visit
Admission: RE | Admit: 2020-04-24 | Discharge: 2020-04-24 | Disposition: A | Discharge status: Home / Self Care | Payer: Medicare PPO | Attending: Infectious Diseases | Admitting: Infectious Diseases

## 2020-04-24 DIAGNOSIS — B182 Chronic viral hepatitis C: Secondary | ICD-10-CM | POA: Insufficient documentation

## 2020-04-24 DIAGNOSIS — Z23 Encounter for immunization: Secondary | ICD-10-CM | POA: Diagnosis not present

## 2020-04-24 NOTE — Telephone Encounter (Signed)
Shanda Bumps from [facility] called on behalf of patient. He told them that the wound dressing order had been changed but they had not received anything yet so she is requesting an updated order to be faxed to [phone] if it has actually changed. If there are any questions, you can call back [phone]

## 2020-04-24 NOTE — Telephone Encounter (Signed)
Patient called to request that we specify in the wound care orders that the supply of bandages should be one bandage per day for 30 days. He states he has been sent 12 bandages before that he has had to make last for the whole month, and with the amount of drainage he has- he would like the order to state 30 units or one per day.

## 2020-04-24 NOTE — Telephone Encounter (Signed)
Wrote a prescription to be faxed to [facility]. Provided to front desk staff to fax. Change mepilex border dressing daily x 90 days.

## 2020-04-25 LAB — HCV RNA QUANT: HCV Quantitative: NOT DETECTED IU/mL (ref >50)

## 2020-04-26 ENCOUNTER — Encounter: Payer: Self-pay | Admitting: Surgical

## 2020-04-26 ENCOUNTER — Other Ambulatory Visit: Payer: Self-pay

## 2020-04-26 ENCOUNTER — Ambulatory Visit (INDEPENDENT_AMBULATORY_CARE_PROVIDER_SITE_OTHER): Payer: Medicare PPO | Admitting: Surgical

## 2020-04-26 VITALS — BP 151/88 | HR 54 | Temp 97.5°F

## 2020-04-26 DIAGNOSIS — S81802D Unspecified open wound, left lower leg, subsequent encounter: Secondary | ICD-10-CM | POA: Diagnosis not present

## 2020-04-26 NOTE — Progress Notes (Signed)
   Subjective:     Patient ID: Peter Sutton, male    DOB: 1955-11-23, 64 y.o.   MRN: [id number]  Chief Complaint  Patient presents with  . Follow-up    HPI: The patient is a 64 y.o. male here for follow-up on his left thigh wound.  He just recently returned from [place] after visiting his daughter.  He reports while he was in [place] the area around the wound was really irritated and he had a lot of drainage as well.  He has been applying steroid cream to the periwound area when he notices the rash/irritation  He reports for dressing changes he has been alternating the alginate and collagen.  He reports he was carrying some wood recently and while caring the wood make his left lower thigh and caused a small wound.  He has been covering this with a small Mepilex border dressing that he purchased on [place]  Review of Systems  Constitutional: Positive for activity change. Negative for fever.  Skin: Positive for rash and wound.     Objective:   Vital Signs BP (!) 151/88 (BP Location: Left Arm, Patient Position: Sitting, Cuff Size: Large)   Pulse (!) 54   Temp (!) 97.5 F (36.4 C) (Axillary)   SpO2 98%  Vital Signs and Nursing Note Reviewed Physical Exam Skin:    General: Skin is warm.          Comments: Left thigh wound with good granular base.  Some fibrinous exudate noted within the wound bed.  There has been no change in the size of the wound, but the granulation tissue has become more uniform.  It is slightly hyper granulated at this time.  The borders are rolled.  Some periwound irritation, but no erythema or foul odor or purulent drainage.  Neurological:     Mental Status: He is alert.      Assessment/Plan:     ICD-10-CM   1. Leg wound, left, subsequent encounter  S81.802D     Recommend applying alginate when he has a lot of drainage, when drainage has improved and subsided, recommend collagen dressing changes.  Due to the large amount of drainage he has had to  change the dressings daily.  Daily dressing changes with Mepilex border dressings.  He has had some difficulty obtaining enough supplies for his dressing changes.  I have sent a prescription through to his insurance company and nursing staff has talked with [facility] as well.  Due to the patient's thigh deformity after his accident he has difficulty keeping a normal bandage in place and Mepilex border dressings have been the only dressings that have stayed in place long enough.  Patient has an appointment scheduled in 1 week for evaluation with Dr. Ulice Bold for possible reexcision of granulation tissue and the wound edges.  Recommend calling with questions or concerns. Pictures were obtained of the patient and placed in the chart with the patient's or guardian's permission.    Kermit Balo Margurite Duffy, PA-C 04/26/2020, 1:51 PM

## 2020-05-04 ENCOUNTER — Ambulatory Visit: Payer: Medicare PPO | Admitting: Plastic Surgery

## 2020-05-09 ENCOUNTER — Other Ambulatory Visit: Payer: Self-pay

## 2020-05-09 ENCOUNTER — Encounter: Payer: Self-pay | Admitting: Plastic Surgery

## 2020-05-09 ENCOUNTER — Ambulatory Visit (INDEPENDENT_AMBULATORY_CARE_PROVIDER_SITE_OTHER): Payer: Medicare PPO | Admitting: Plastic Surgery

## 2020-05-09 ENCOUNTER — Ambulatory Visit: Payer: Medicare PPO | Admitting: Plastic Surgery

## 2020-05-09 VITALS — BP 136/88 | HR 56 | Temp 98.2°F

## 2020-05-09 DIAGNOSIS — S81802A Unspecified open wound, left lower leg, initial encounter: Secondary | ICD-10-CM

## 2020-05-15 ENCOUNTER — Ambulatory Visit: Payer: Medicare PPO | Admitting: Plastic Surgery

## 2020-05-17 ENCOUNTER — Ambulatory Visit: Payer: Medicare PPO | Admitting: Surgical

## 2020-05-20 DEATH — deceased

## 2020-05-23 NOTE — Progress Notes (Signed)
Mr. Peter Sutton is a 64 year old male with a left upper leg thigh wound.  We have been working with this for several weeks after excision and debridement.  It is feeling it has just been very slow.  It does not appear to be infected.  We had talked about doing an excision today.  Since the last time I saw him it has improved which she agrees to.  He was happy with the decision to use silver nitrate around the edges to improve the hypergranulation.  We will then continue with local dressing changes.  I will see him back once he returns from his vacation.

## 2021-05-14 IMAGING — US US ABDOMEN COMPLETE WITH ELASTOGRAPHY
2 series · 13 of 25 positions shown · non-contrast
Comparison: None.

CLINICAL DATA: Hepatitis-C.  Evaluate for cirrhosis



[Series 1: us abdomen complete with elastography · 0.20mm/px · 7 of 132 slices shown (1 of 2)]
[im 1/132]
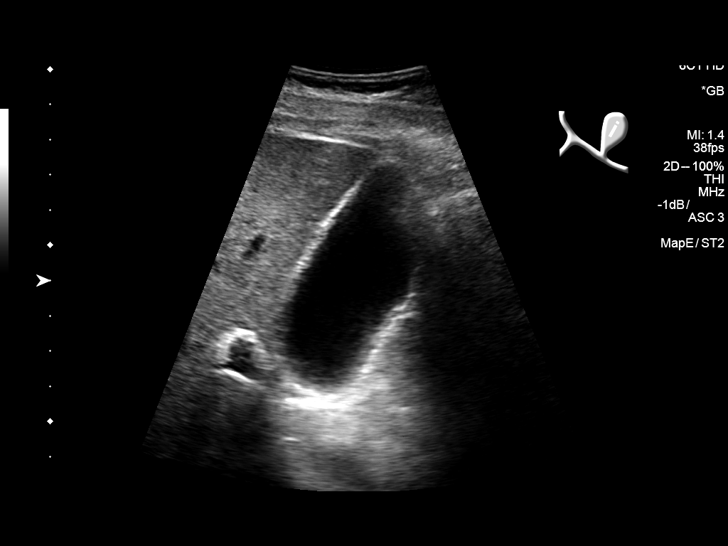
[im 22/132]
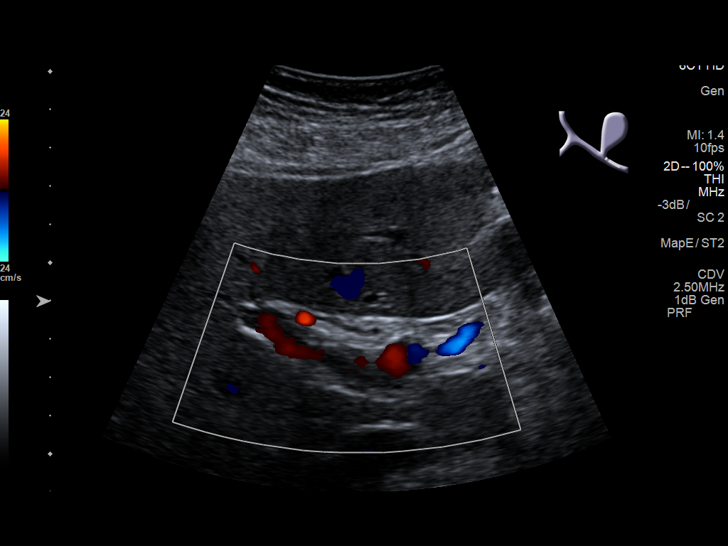
[im 44/132]
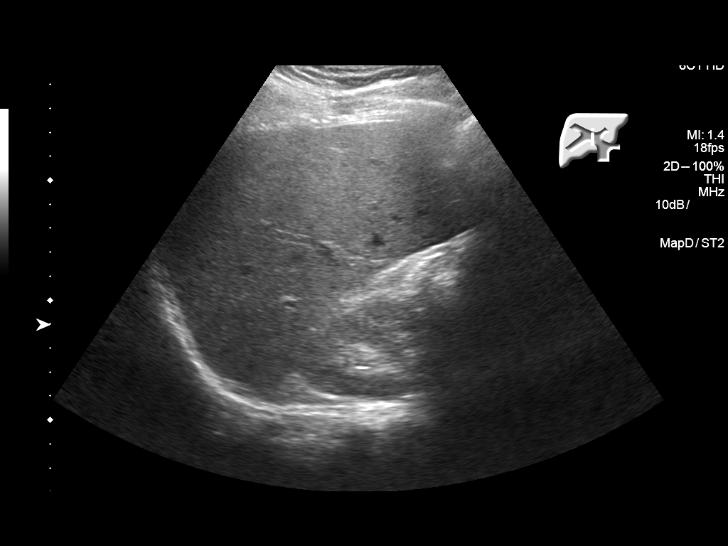
[im 66/132]
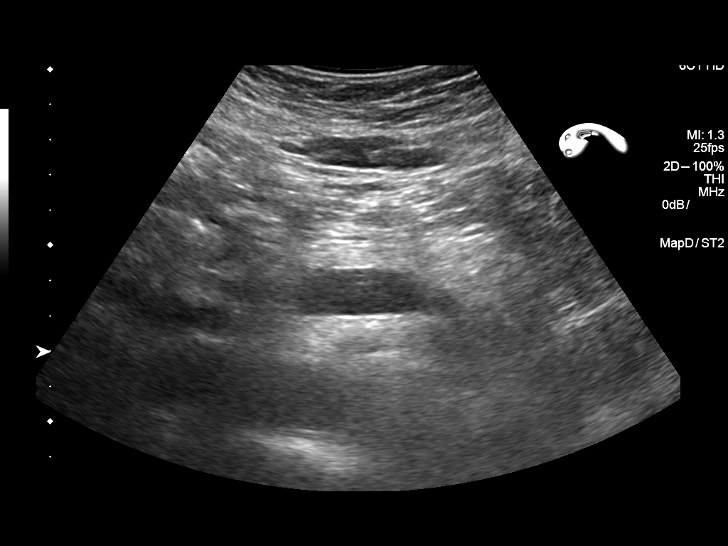
[im 88/132]
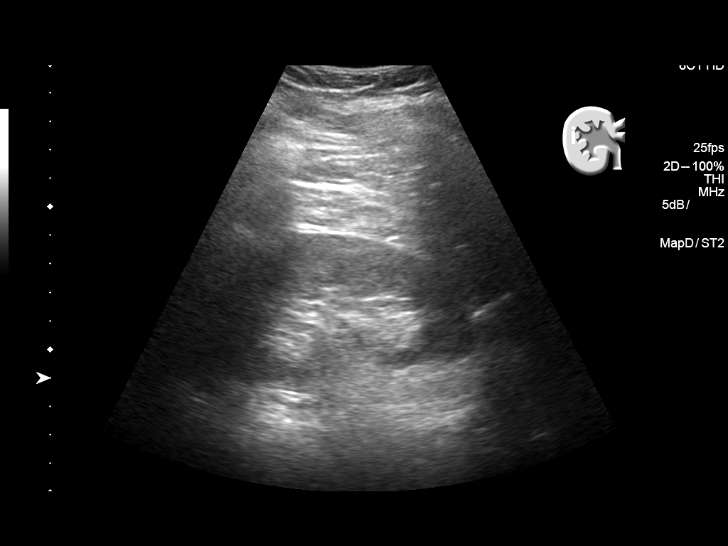
[im 110/132]
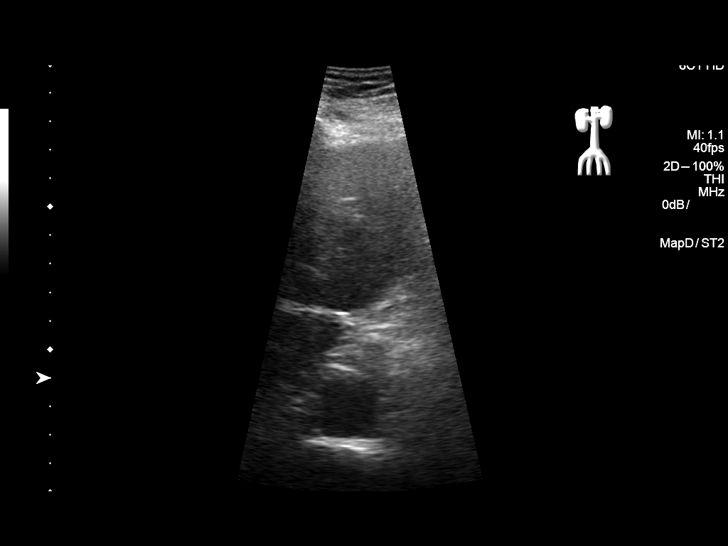
[im 132/132]
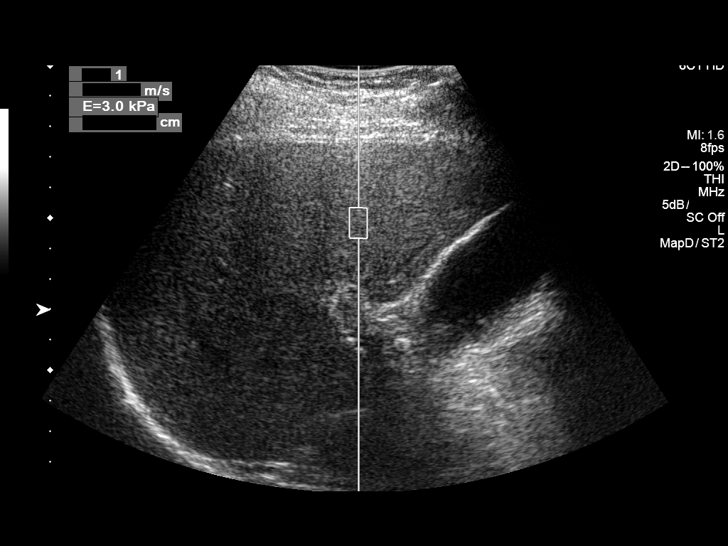

[Series 1: us abdomen complete with elastography · 0.20mm/px · 6 of 131 slices shown (2 of 2)]
[im 12/131]
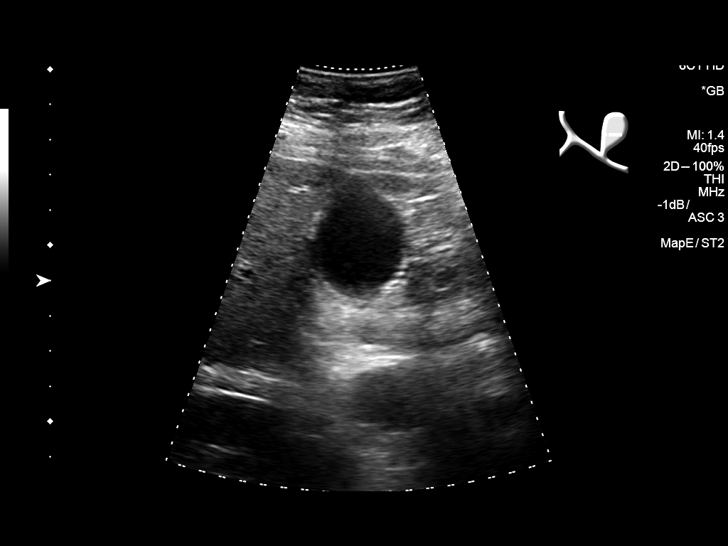
[im 36/131]
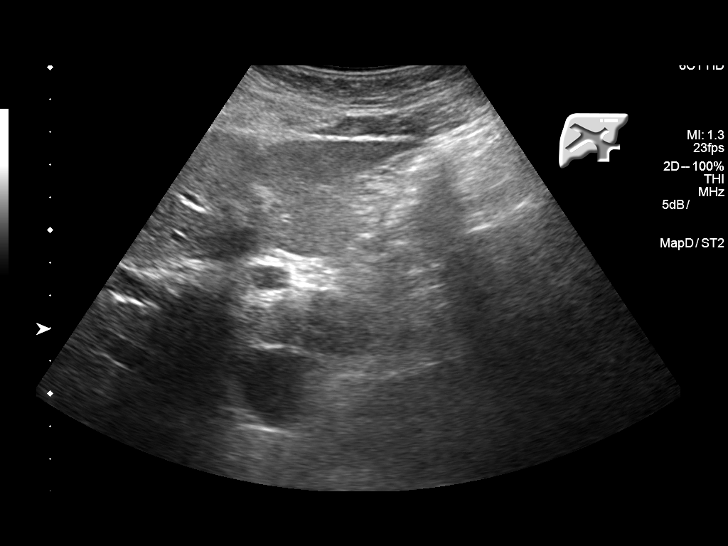
[im 60/131]
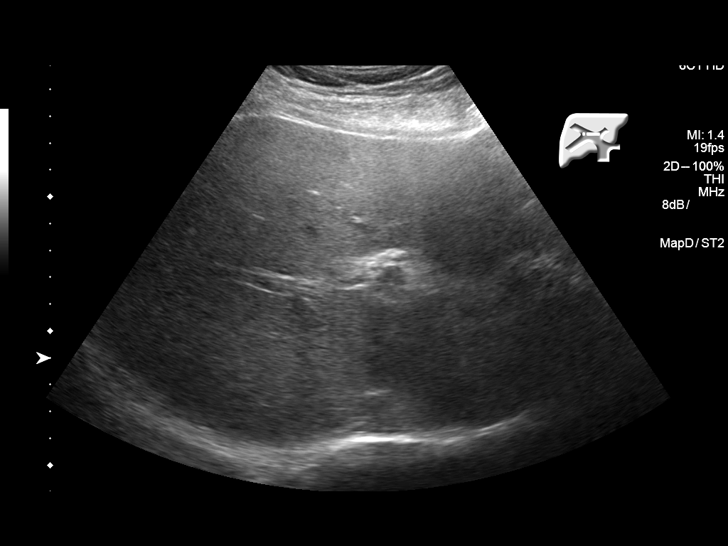
[im 83/131]
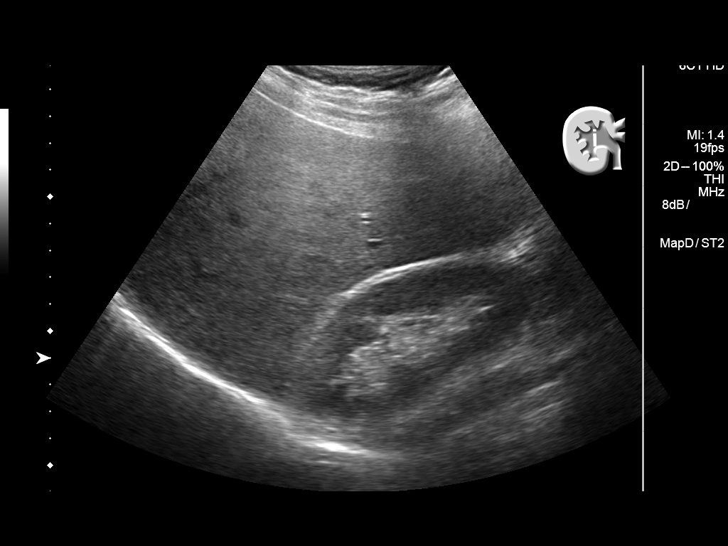
[im 107/131]
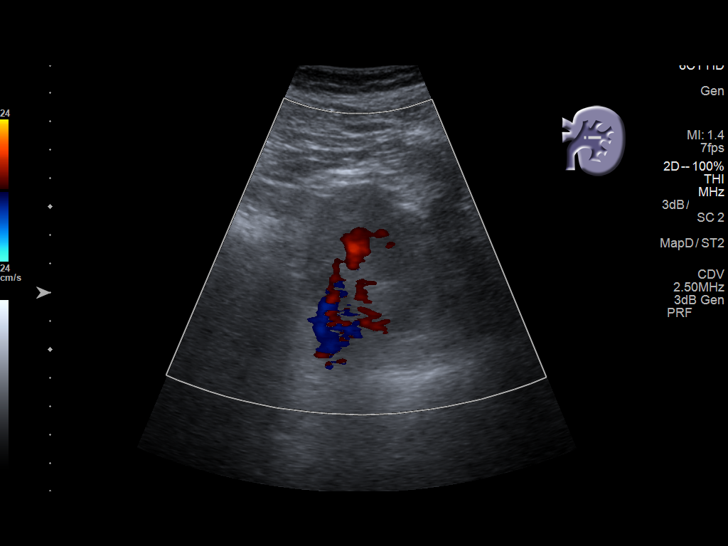
[im 131/131]
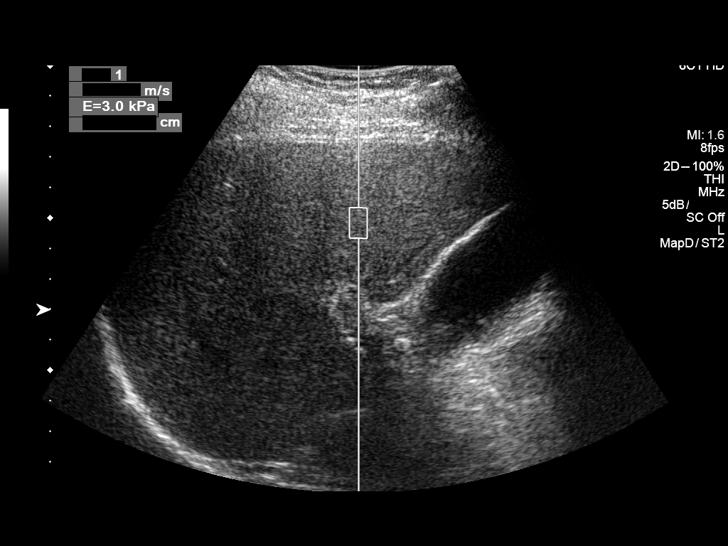

[13 of 25 positions shown; findings below may reference images not displayed]

FINDINGS: ULTRASOUND ABDOMEN

Gallbladder: No gallstones or wall thickening visualized. No
sonographic Murphy sign noted by sonographer.

Common bile duct: Diameter: 1.2 mm

Liver: No focal lesion identified. Within normal limits in
parenchymal echogenicity. Portal vein is patent on color Doppler
imaging with normal direction of blood flow towards the liver.

IVC: No abnormality visualized.

Pancreas: Visualized portion unremarkable.

Spleen: Normal size. Multiple echogenic foci consistent with benign
granulomata of the spleen.

Right Kidney: Length: 9.4. Echogenicity within normal limits. No
mass or hydronephrosis visualized.

Left Kidney: Length: 10.8 cm. Echogenicity within normal limits. No
mass or hydronephrosis visualized.

Abdominal aorta: No aneurysm visualized.

Other findings: None.

ULTRASOUND HEPATIC ELASTOGRAPHY

Device: Siemens Helix VTQ

Patient position: Supine

Transducer 6C1

Number of measurements: 10

Hepatic segment:  8

Median velocity:   0.92 m/sec

IQR:

IQR/Median velocity ratio: Insert

Corresponding Metavir fibrosis score:  F0/F1

Risk of fibrosis: Minimal

Limitations of exam: None

Please note that abnormal shear wave velocities may also be
identified in clinical settings other than with hepatic fibrosis,
such as: acute hepatitis, elevated right heart and central venous
pressures including use of beta blockers, Ivets disease
([place]), infiltrative processes such as
mastocytosis/amyloidosis/infiltrative tumor, extrahepatic
cholestasis, in the post-prandial state, and liver transplantation.
Correlation with patient history, laboratory data, and clinical
condition recommended.
IMPRESSION: ULTRASOUND ABDOMEN: No focal hepatic lesion.  No evidence cirrhosis.

ULTRASOUND HEPATIC ELASTOGRAPHY:

Median hepatic shear wave velocity is calculated at 0.92 m/sec.

Corresponding Metavir fibrosis score is F0/F1.

Risk of fibrosis is Minimal.

Follow-up: None required
